# Patient Record
Sex: Female | Born: 1935 | Race: White | Hispanic: No | Marital: Married | Smoking: Never smoker
Health system: Southern US, Community
[De-identification: ages and names within clinical notes are randomized; demographics above are authoritative.]

## PROBLEM LIST (undated history)

## (undated) DIAGNOSIS — C539 Malignant neoplasm of cervix uteri, unspecified: Secondary | ICD-10-CM

## (undated) DIAGNOSIS — E785 Hyperlipidemia, unspecified: Secondary | ICD-10-CM

## (undated) DIAGNOSIS — E538 Deficiency of other specified B group vitamins: Secondary | ICD-10-CM

## (undated) DIAGNOSIS — F0390 Unspecified dementia without behavioral disturbance: Secondary | ICD-10-CM

## (undated) DIAGNOSIS — I10 Essential (primary) hypertension: Secondary | ICD-10-CM

## (undated) DIAGNOSIS — D869 Sarcoidosis, unspecified: Secondary | ICD-10-CM

## (undated) DIAGNOSIS — C50919 Malignant neoplasm of unspecified site of unspecified female breast: Secondary | ICD-10-CM

## (undated) DIAGNOSIS — M5136 Other intervertebral disc degeneration, lumbar region: Secondary | ICD-10-CM

---

## 1998-10-06 ENCOUNTER — Other Ambulatory Visit: Admission: RE | Admit: 1998-10-06 | Discharge: 1998-10-06 | Payer: Self-pay | Admitting: *Deleted

## 2001-07-29 ENCOUNTER — Ambulatory Visit: Admission: RE | Admit: 2001-07-29 | Discharge: 2001-07-29 | Payer: Self-pay | Admitting: Gynecology

## 2001-07-30 ENCOUNTER — Ambulatory Visit: Admission: RE | Admit: 2001-07-30 | Discharge: 2001-10-28 | Payer: Self-pay | Admitting: Radiation Oncology

## 2001-09-23 ENCOUNTER — Inpatient Hospital Stay (HOSPITAL_COMMUNITY): Admission: RE | Admit: 2001-09-23 | Discharge: 2001-09-26 | Payer: Self-pay | Admitting: Radiation Oncology

## 2002-01-07 ENCOUNTER — Ambulatory Visit: Admission: RE | Admit: 2002-01-07 | Discharge: 2002-01-07 | Payer: Self-pay | Admitting: Gynecology

## 2002-01-07 ENCOUNTER — Other Ambulatory Visit: Admission: RE | Admit: 2002-01-07 | Discharge: 2002-01-07 | Payer: Self-pay | Admitting: Gynecology

## 2002-04-14 ENCOUNTER — Other Ambulatory Visit: Admission: RE | Admit: 2002-04-14 | Discharge: 2002-04-14 | Payer: Self-pay | Admitting: Radiation Oncology

## 2002-07-15 ENCOUNTER — Ambulatory Visit: Admission: RE | Admit: 2002-07-15 | Discharge: 2002-07-15 | Payer: Self-pay | Admitting: Gynecology

## 2002-07-15 ENCOUNTER — Other Ambulatory Visit: Admission: RE | Admit: 2002-07-15 | Discharge: 2002-07-15 | Payer: Self-pay | Admitting: Gynecology

## 2002-10-20 ENCOUNTER — Other Ambulatory Visit: Admission: RE | Admit: 2002-10-20 | Discharge: 2002-10-20 | Payer: Self-pay | Admitting: Radiation Oncology

## 2003-12-15 ENCOUNTER — Other Ambulatory Visit: Admission: RE | Admit: 2003-12-15 | Discharge: 2003-12-15 | Payer: Self-pay | Admitting: Gynecologic Oncology

## 2003-12-15 ENCOUNTER — Ambulatory Visit: Admission: RE | Admit: 2003-12-15 | Discharge: 2003-12-15 | Payer: Self-pay | Admitting: Gynecologic Oncology

## 2004-06-06 ENCOUNTER — Ambulatory Visit: Admission: RE | Admit: 2004-06-06 | Discharge: 2004-06-06 | Payer: Self-pay | Admitting: Radiation Oncology

## 2004-06-06 ENCOUNTER — Other Ambulatory Visit: Admission: RE | Admit: 2004-06-06 | Discharge: 2004-06-06 | Payer: Self-pay | Admitting: Radiation Oncology

## 2004-11-10 ENCOUNTER — Other Ambulatory Visit: Admission: RE | Admit: 2004-11-10 | Discharge: 2004-11-10 | Payer: Self-pay | Admitting: Gynecology

## 2004-11-10 ENCOUNTER — Ambulatory Visit: Admission: RE | Admit: 2004-11-10 | Discharge: 2004-11-10 | Payer: Self-pay | Admitting: Gynecology

## 2013-07-27 ENCOUNTER — Emergency Department (HOSPITAL_COMMUNITY)
Admission: EM | Admit: 2013-07-27 | Discharge: 2013-07-27 | Disposition: A | Discharge status: Skilled Nursing Facility | Payer: Medicare Other | Attending: Emergency Medicine | Admitting: Emergency Medicine

## 2013-07-27 ENCOUNTER — Encounter (HOSPITAL_COMMUNITY): Payer: Self-pay | Admitting: Emergency Medicine

## 2013-07-27 ENCOUNTER — Emergency Department (HOSPITAL_COMMUNITY): Payer: Medicare Other

## 2013-07-27 DIAGNOSIS — Z79899 Other long term (current) drug therapy: Secondary | ICD-10-CM | POA: Insufficient documentation

## 2013-07-27 DIAGNOSIS — Z862 Personal history of diseases of the blood and blood-forming organs and certain disorders involving the immune mechanism: Secondary | ICD-10-CM | POA: Insufficient documentation

## 2013-07-27 DIAGNOSIS — S0990XA Unspecified injury of head, initial encounter: Secondary | ICD-10-CM

## 2013-07-27 DIAGNOSIS — E538 Deficiency of other specified B group vitamins: Secondary | ICD-10-CM | POA: Insufficient documentation

## 2013-07-27 DIAGNOSIS — S0180XA Unspecified open wound of other part of head, initial encounter: Secondary | ICD-10-CM | POA: Insufficient documentation

## 2013-07-27 DIAGNOSIS — Y9210 Unspecified residential institution as the place of occurrence of the external cause: Secondary | ICD-10-CM | POA: Insufficient documentation

## 2013-07-27 DIAGNOSIS — S0181XA Laceration without foreign body of other part of head, initial encounter: Secondary | ICD-10-CM

## 2013-07-27 DIAGNOSIS — Z880 Allergy status to penicillin: Secondary | ICD-10-CM | POA: Insufficient documentation

## 2013-07-27 DIAGNOSIS — Y939 Activity, unspecified: Secondary | ICD-10-CM | POA: Insufficient documentation

## 2013-07-27 DIAGNOSIS — W19XXXA Unspecified fall, initial encounter: Secondary | ICD-10-CM

## 2013-07-27 DIAGNOSIS — Z853 Personal history of malignant neoplasm of breast: Secondary | ICD-10-CM | POA: Insufficient documentation

## 2013-07-27 DIAGNOSIS — I10 Essential (primary) hypertension: Secondary | ICD-10-CM | POA: Insufficient documentation

## 2013-07-27 DIAGNOSIS — Z8619 Personal history of other infectious and parasitic diseases: Secondary | ICD-10-CM | POA: Insufficient documentation

## 2013-07-27 DIAGNOSIS — Z8639 Personal history of other endocrine, nutritional and metabolic disease: Secondary | ICD-10-CM | POA: Insufficient documentation

## 2013-07-27 DIAGNOSIS — M51379 Other intervertebral disc degeneration, lumbosacral region without mention of lumbar back pain or lower extremity pain: Secondary | ICD-10-CM | POA: Insufficient documentation

## 2013-07-27 DIAGNOSIS — M5137 Other intervertebral disc degeneration, lumbosacral region: Secondary | ICD-10-CM | POA: Insufficient documentation

## 2013-07-27 DIAGNOSIS — Z8541 Personal history of malignant neoplasm of cervix uteri: Secondary | ICD-10-CM | POA: Insufficient documentation

## 2013-07-27 DIAGNOSIS — W1809XA Striking against other object with subsequent fall, initial encounter: Secondary | ICD-10-CM | POA: Insufficient documentation

## 2013-07-27 DIAGNOSIS — F0390 Unspecified dementia without behavioral disturbance: Secondary | ICD-10-CM | POA: Insufficient documentation

## 2013-07-27 HISTORY — DX: Sarcoidosis, unspecified: D86.9

## 2013-07-27 HISTORY — DX: Hyperlipidemia, unspecified: E78.5

## 2013-07-27 HISTORY — DX: Deficiency of other specified B group vitamins: E53.8

## 2013-07-27 HISTORY — DX: Malignant neoplasm of cervix uteri, unspecified: C53.9

## 2013-07-27 HISTORY — DX: Essential (primary) hypertension: I10

## 2013-07-27 HISTORY — DX: Other intervertebral disc degeneration, lumbar region: M51.36

## 2013-07-27 HISTORY — DX: Malignant neoplasm of unspecified site of unspecified female breast: C50.919

## 2013-07-27 HISTORY — DX: Unspecified dementia, unspecified severity, without behavioral disturbance, psychotic disturbance, mood disturbance, and anxiety: F03.90

## 2013-07-27 NOTE — ED Notes (Signed)
Bed: WA05 Expected date:  Expected time:  Means of arrival:  Comments: fall 

## 2013-07-27 NOTE — ED Notes (Addendum)
[facility] called for transport.  

## 2013-07-27 NOTE — ED Notes (Signed)
Patient transported to CT 

## 2013-07-27 NOTE — ED Notes (Signed)
Pt tripped and fell over O2 tubing.  Pt attempted to catch herself but was unsuccessful.  Pt has laceration above right eye.  Pt is drowsy.  No LOC reported.

## 2013-07-27 NOTE — ED Provider Notes (Signed)
CSN: [id number]     Arrival date & time 07/27/13  1337 History   First MD Initiated Contact with Patient 07/27/13 1405     Chief Complaint  Patient presents with  . Fall   (Consider location/radiation/quality/duration/timing/severity/associated sxs/prior Treatment) HPI Comments: Patient with h/o dementia with witnessed fall at her nursing facility. She apparently tripped over oxygen tubing and fell, striking her head. C/o laceration above R eye. No LOC per EMS. Level V caveat 2/2 dementia.   The history is provided by the patient.    Past Medical History  Diagnosis Date  . Dementia   . Cervical cancer   . Breast cancer   . DDD (degenerative disc disease), lumbar   . Hyperlipidemia   . Hypertension   . Vitamin B 12 deficiency   . Sarcoidosis    History reviewed. No pertinent past surgical history. History reviewed. No pertinent family history. History  Substance Use Topics  . Smoking status: Never Smoker   . Smokeless tobacco: Not on file  . Alcohol Use: No   OB History   Grav Para Term Preterm Abortions TAB SAB Ect Mult Living                 Review of Systems  Unable to perform ROS: Dementia    Allergies  Morphine and related; Other; and Penicillins  Home Medications   Current Outpatient Rx  Name  Route  Sig  Dispense  Refill  . ALPRAZolam (XANAX) 0.25 MG tablet   Oral   Take 0.25 mg by mouth every 8 (eight) hours as needed for anxiety.         . mometasone-formoterol (DULERA) 100-5 MCG/ACT AERO   Inhalation   Inhale 2 puffs into the lungs 2 (two) times daily.         . traMADol (ULTRAM) 50 MG tablet   Oral   Take 50 mg by mouth every 6 (six) hours as needed for pain.         . vitamin B-12 (CYANOCOBALAMIN) 500 MCG tablet   Oral   Take 500 mcg by mouth daily.          BP 139/89  Pulse 87  Temp(Src) 98.5 F (36.9 C) (Axillary)  Resp 20  SpO2 100% Physical Exam  Nursing note and vitals reviewed. Constitutional: She appears well-developed  and well-nourished.  HENT:  Head: Normocephalic. Head is without raccoon's eyes and without Battle's sign.  Right Ear: Tympanic membrane, external ear and ear canal normal. No hemotympanum.  Left Ear: Tympanic membrane, external ear and ear canal normal. No hemotympanum.  Nose: Nose normal. No nasal septal hematoma.  Mouth/Throat: Uvula is midline, oropharynx is clear and moist and mucous membranes are normal.  1cm laceration just above lateral margin of right eye.  Eyes: Conjunctivae, EOM and lids are normal. Pupils are equal, round, and reactive to light. Right eye exhibits no nystagmus. Left eye exhibits no nystagmus.  No visible hyphema noted  Neck: Normal range of motion. Neck supple.  Cardiovascular: Normal rate and regular rhythm.   Pulmonary/Chest: Effort normal and breath sounds normal.  Abdominal: Soft. There is no tenderness.  Musculoskeletal:       Cervical back: She exhibits normal range of motion, no tenderness and no bony tenderness.       Thoracic back: She exhibits no tenderness and no bony tenderness.       Lumbar back: She exhibits no tenderness and no bony tenderness.  Gross movement all extremities.   Neurological:  She is alert. She has normal strength. No cranial nerve deficit or sensory deficit. Coordination normal. GCS eye subscore is 4. GCS verbal subscore is 5. GCS motor subscore is 6.  Limited ability to cooperate with neuro exam due to dementia.   Skin: Skin is warm and dry.  Psychiatric: She has a normal mood and affect.    ED Course  Procedures (including critical care time) Labs Review Labs Reviewed - No data to display Imaging Review Ct Head Wo Contrast  07/27/2013   *RADIOLOGY REPORT*  Clinical Data:  79 year old female with fall, head and neck injury, confusion and altered mental status  CT HEAD WITHOUT CONTRAST CT CERVICAL SPINE WITHOUT CONTRAST  Technique:  Multidetector CT imaging of the head and cervical spine was performed following the standard  protocol without intravenous contrast.  Multiplanar CT image reconstructions of the cervical spine were also generated.  Comparison:  None  CT HEAD  Findings: Atrophy and chronic small vessel white matter ischemic changes are noted. No acute intracranial abnormalities are identified, including mass lesion or mass effect, hydrocephalus, extra-axial fluid collection, midline shift, hemorrhage, or acute infarction.  The visualized bony calvarium is unremarkable. Right forehead soft tissue swelling is noted.  IMPRESSION: No evidence of acute intracranial abnormality.  Right forehead soft tissue swelling without fracture.  Atrophy and chronic small vessel white matter ischemic changes.  CT CERVICAL SPINE  Findings: Normal alignment is noted. There is no evidence of fracture, subluxation or prevertebral soft tissue swelling. No focal bony lesions are present. Moderate multilevel degenerative disc disease and facet arthropathy throughout the cervical spine noted. The soft tissue structures are within normal limits.  IMPRESSION: No static evidence of acute injury to the cervical spine.  Moderate multilevel degenerative changes.   Original Report Authenticated By: Harmon Pier, M.D.   Ct Cervical Spine Wo Contrast  07/27/2013   *RADIOLOGY REPORT*  Clinical Data:  79 year old female with fall, head and neck injury, confusion and altered mental status  CT HEAD WITHOUT CONTRAST CT CERVICAL SPINE WITHOUT CONTRAST  Technique:  Multidetector CT imaging of the head and cervical spine was performed following the standard protocol without intravenous contrast.  Multiplanar CT image reconstructions of the cervical spine were also generated.  Comparison:  None  CT HEAD  Findings: Atrophy and chronic small vessel white matter ischemic changes are noted. No acute intracranial abnormalities are identified, including mass lesion or mass effect, hydrocephalus, extra-axial fluid collection, midline shift, hemorrhage, or acute infarction.   The visualized bony calvarium is unremarkable. Right forehead soft tissue swelling is noted.  IMPRESSION: No evidence of acute intracranial abnormality.  Right forehead soft tissue swelling without fracture.  Atrophy and chronic small vessel white matter ischemic changes.  CT CERVICAL SPINE  Findings: Normal alignment is noted. There is no evidence of fracture, subluxation or prevertebral soft tissue swelling. No focal bony lesions are present. Moderate multilevel degenerative disc disease and facet arthropathy throughout the cervical spine noted. The soft tissue structures are within normal limits.  IMPRESSION: No static evidence of acute injury to the cervical spine.  Moderate multilevel degenerative changes.   Original Report Authenticated By: Harmon Pier, M.D.    3:10 PM Patient seen and examined. Work-up initiated.   Vital signs reviewed and are as follows: Filed Vitals:   07/27/13 1349  BP: 139/89  Pulse: 87  Temp: 98.5 F (36.9 C)  Resp: 20   LACERATION REPAIR Performed by: Dianah Field PA-S under my supervision Authorized by: Carolee Rota Consent:  Verbal consent obtained. Risks and benefits: risks, benefits and alternatives were discussed Consent given by: patient Patient identity confirmed: provided demographic data Prepped and Draped in normal sterile fashion Wound explored  Laceration Location: lateral to R eyebrow  Laceration Length: 1cm  No Foreign Bodies seen or palpated  Anesthesia: local infiltration  Local anesthetic: lidocaine 2% with epinephrine  Anesthetic total: 5 ml  Irrigation method: skin scrub with dermal cleanser Amount of cleaning: standard  Skin closure:   Number of sutures: 2  Technique: simple interrupted  Patient tolerance: Patient tolerated the procedure well with no immediate complications.  3:14 PM CTs neg. Family counseled on wound care.   Counseled on head injury precautions and symptoms that should indicate  their return to the ED.  These include severe worsening headache, vision changes, confusion, loss of consciousness, trouble walking, nausea & vomiting, or weakness/tingling in extremities.     MDM   1. Facial laceration, initial encounter   2. Head injury, initial encounter   3. Fall, initial encounter    Pt with fall caused by tripping on tubing. CT head, neck shows no acute injury. Laceration repaired without complications.    Renne Crigler, PA-C 07/27/13 1523

## 2013-07-27 NOTE — ED Notes (Signed)
Pt arrives by EMS soiled in feces and urine through clothes and brief.

## 2013-07-30 NOTE — ED Provider Notes (Signed)
Medical screening examination/treatment/procedure(s) were conducted as a shared visit with non-physician practitioner(s) and myself.  I personally evaluated the patient during the encounter.  Excellent laceration repair. Patient mentating at baseline. Normal studies. No sign of skull fracture. Ground-level fall per report patient unable to provide additional details, I agree with discharge   Claudean Kinds, MD 07/30/13 [phone]

## 2013-10-26 ENCOUNTER — Encounter (HOSPITAL_COMMUNITY): Payer: Self-pay | Admitting: Emergency Medicine

## 2013-10-26 ENCOUNTER — Emergency Department (HOSPITAL_COMMUNITY): Payer: Medicare Other

## 2013-10-26 ENCOUNTER — Emergency Department (HOSPITAL_COMMUNITY)
Admission: EM | Admit: 2013-10-26 | Discharge: 2013-10-26 | Disposition: A | Discharge status: Home / Self Care | Payer: Medicare Other | Attending: Emergency Medicine | Admitting: Emergency Medicine

## 2013-10-26 DIAGNOSIS — Y9389 Activity, other specified: Secondary | ICD-10-CM | POA: Insufficient documentation

## 2013-10-26 DIAGNOSIS — M542 Cervicalgia: Secondary | ICD-10-CM | POA: Insufficient documentation

## 2013-10-26 DIAGNOSIS — D869 Sarcoidosis, unspecified: Secondary | ICD-10-CM | POA: Insufficient documentation

## 2013-10-26 DIAGNOSIS — F0390 Unspecified dementia without behavioral disturbance: Secondary | ICD-10-CM | POA: Insufficient documentation

## 2013-10-26 DIAGNOSIS — I10 Essential (primary) hypertension: Secondary | ICD-10-CM | POA: Insufficient documentation

## 2013-10-26 DIAGNOSIS — W19XXXA Unspecified fall, initial encounter: Secondary | ICD-10-CM

## 2013-10-26 DIAGNOSIS — W050XXA Fall from non-moving wheelchair, initial encounter: Secondary | ICD-10-CM | POA: Insufficient documentation

## 2013-10-26 DIAGNOSIS — E538 Deficiency of other specified B group vitamins: Secondary | ICD-10-CM | POA: Insufficient documentation

## 2013-10-26 DIAGNOSIS — Y9210 Unspecified residential institution as the place of occurrence of the external cause: Secondary | ICD-10-CM | POA: Insufficient documentation

## 2013-10-26 DIAGNOSIS — M25569 Pain in unspecified knee: Secondary | ICD-10-CM | POA: Insufficient documentation

## 2013-10-26 DIAGNOSIS — E785 Hyperlipidemia, unspecified: Secondary | ICD-10-CM | POA: Insufficient documentation

## 2013-10-26 DIAGNOSIS — M51379 Other intervertebral disc degeneration, lumbosacral region without mention of lumbar back pain or lower extremity pain: Secondary | ICD-10-CM | POA: Insufficient documentation

## 2013-10-26 DIAGNOSIS — M25559 Pain in unspecified hip: Secondary | ICD-10-CM | POA: Insufficient documentation

## 2013-10-26 DIAGNOSIS — M5137 Other intervertebral disc degeneration, lumbosacral region: Secondary | ICD-10-CM | POA: Insufficient documentation

## 2013-10-26 LAB — URINALYSIS, ROUTINE W REFLEX MICROSCOPIC
Bilirubin Urine: NEGATIVE
Ketones, ur: NEGATIVE mg/dL
Leukocytes, UA: NEGATIVE
Nitrite: NEGATIVE
Protein, ur: NEGATIVE mg/dL
Specific Gravity, Urine: 1.007 (ref 1.005–1.030)
Urobilinogen, UA: 0.2 mg/dL (ref 0.0–1.0)
pH: 6 (ref 5.0–8.0)

## 2013-10-26 MED ORDER — HALOPERIDOL LACTATE 5 MG/ML IJ SOLN
2.5000 mg | Freq: Once | INTRAMUSCULAR | Status: AC
  Administered 2013-10-26: 2.5 mg via INTRAMUSCULAR
  Filled 2013-10-26: qty 1

## 2013-10-26 NOTE — Progress Notes (Signed)
   CARE MANAGEMENT ED NOTE 10/26/2013  Patient:  CARROL, HOUGLAND   Account Number:  [id number]  Date Initiated:  10/26/2013  Documentation initiated by:  Edd Arbour  Subjective/Objective Assessment:   79 yr old female medicare pt pcp is greg grisso per facility notes     Subjective/Objective Assessment Detail:     Action/Plan:   EPIC update   Action/Plan Detail:   Anticipated DC Date:       Status Recommendation to Physician:   Result of Recommendation:    Other ED Services  Consult Working Plan    DC Planning Services  Outpatient Services - Pt will follow up  PCP issues  Other    Choice offered to / List presented to:            Status of service:  Completed, signed off  ED Comments:   ED Comments Detail:

## 2013-10-26 NOTE — ED Notes (Addendum)
Per EMS pt was trying to sit in wheelchair, missed wheelchair and hit head without LOC. Pt complaint of right sided posterior head, neck, and back pain on EMS arrival. Pt denies denies pain at present time. Pt has hx of dementia, a/o to self.

## 2013-10-26 NOTE — ED Provider Notes (Signed)
CSN: [id number]     Arrival date & time 10/26/13  1158 History   First MD Initiated Contact with Patient 10/26/13 1216     Chief Complaint  Patient presents with  . Fall   (Consider location/radiation/quality/duration/timing/severity/associated sxs/prior Treatment) HPI 79 yo presents to the ED by EMS. Per EMS patient had a fall while transferring to her wheelchair and that patient reports posterior head and neck pain. I contacted [facility] nursing home and spoke with Arminda Resides (Med tech) who reported that she had been combative over the past week which is out of the normal for her behavior. Received mixed statements about wether the fall was witnessed or not. Nursing home reports it was unwitnessed. Patient denies any pain currently. Patient is poor historian. Level V caveat secondary to Dementia.   Past Medical History  Diagnosis Date  . Dementia   . Cervical cancer   . Breast cancer   . DDD (degenerative disc disease), lumbar   . Hyperlipidemia   . Hypertension   . Vitamin B 12 deficiency   . Sarcoidosis    History reviewed. No pertinent past surgical history. No family history on file. History  Substance Use Topics  . Smoking status: Never Smoker   . Smokeless tobacco: Not on file  . Alcohol Use: No   OB History   Grav Para Term Preterm Abortions TAB SAB Ect Mult Living                 Review of Systems  Unable to perform ROS: Dementia    Allergies  Morphine and related; Other; and Penicillins  Home Medications   Current Outpatient Rx  Name  Route  Sig  Dispense  Refill  . mometasone-formoterol (DULERA) 100-5 MCG/ACT AERO   Inhalation   Inhale 2 puffs into the lungs 2 (two) times daily.         . vitamin B-12 (CYANOCOBALAMIN) 500 MCG tablet   Oral   Take 500 mcg by mouth daily.         . vitamin D, CHOLECALCIFEROL, 400 UNITS tablet   Oral   Take 400 Units by mouth daily.         Marland Kitchen ALPRAZolam (XANAX) 0.25 MG tablet   Oral   Take 0.25 mg  by mouth every 8 (eight) hours as needed for anxiety.         . traMADol (ULTRAM) 50 MG tablet   Oral   Take 50 mg by mouth every 6 (six) hours as needed for pain.          BP 124/66  Pulse 63  Resp 30  SpO2 94% Physical Exam  Vitals reviewed. Constitutional: Vital signs are normal. She appears well-developed and well-nourished. She is active. She does not appear ill. No distress. Cervical collar and nasal cannula in place.  HENT:  Head: Normocephalic and atraumatic. Head is without raccoon's eyes, without Battle's sign and without laceration.  Right Ear: External ear normal.  Left Ear: External ear normal.  Nose: Nose normal.  Mouth/Throat: Oropharynx is clear and moist.  Eyes: Conjunctivae and EOM are normal. Pupils are equal, round, and reactive to light. No scleral icterus.  Neck:  Patient in cervical collar.   Cardiovascular: Normal rate and regular rhythm.  Exam reveals no gallop and no friction rub.   No murmur heard. Pulmonary/Chest: Effort normal. No respiratory distress. She has no wheezes. She has no rales. She exhibits no tenderness.  Abdominal: Soft. Bowel sounds are normal. She exhibits  no distension. There is no tenderness. There is no rebound and no guarding.  Musculoskeletal: She exhibits no edema.       Right hip: She exhibits tenderness.       Left knee: Tenderness found.  Neurological: She is alert. She has normal strength. No cranial nerve deficit or sensory deficit.  Reflex Scores:      Bicep reflexes are 2+ on the right side and 2+ on the left side.      Patellar reflexes are 2+ on the right side and 2+ on the left side. Patient follows commands and answers questions. But is unaware of what has happened.  Skin: Skin is warm and dry. No rash noted. She is not diaphoretic.  Psychiatric: Her speech is not slurred. Cognition and memory are impaired.    ED Course  Procedures (including critical care time) Labs Review Labs Reviewed  URINALYSIS, ROUTINE  W REFLEX MICROSCOPIC   Imaging Review Dg Hip Complete Right  10/26/2013   CLINICAL DATA:  Fall  EXAM: RIGHT HIP - COMPLETE 2+ VIEW  COMPARISON:  None.  FINDINGS: Three views of right hip submitted. No acute fracture or subluxation. Diffuse osteopenia. Mild spurring of greater femoral trochanter. Mild bilateral superior acetabular spurring.  IMPRESSION: No acute fracture or subluxation.  Diffuse osteopenia.   Electronically Signed   By: Natasha Mead M.D.   On: 10/26/2013 14:54   Ct Head Wo Contrast  10/26/2013   CLINICAL DATA:  Fall  EXAM: CT HEAD WITHOUT CONTRAST  CT CERVICAL SPINE WITHOUT CONTRAST  TECHNIQUE: Multidetector CT imaging of the head and cervical spine was performed following the standard protocol without intravenous contrast. Multiplanar CT image reconstructions of the cervical spine were also generated.  COMPARISON:  CT 07/27/2013  FINDINGS: CT HEAD FINDINGS  Moderate to advanced atrophy. Moderate chronic microvascular ischemic change in the white matter. No acute infarct. Negative for hemorrhage or mass. Negative for skull fracture.  CT CERVICAL SPINE FINDINGS  Normal cervical alignment. Negative for fracture or mass. No bony lesion is seen.  Moderate spondylosis in the cervical spine C3 through C7 with spurring. Right facet degeneration most prominent C3-4.  Severe bilateral lung disease in the apices most likely chronic.  IMPRESSION: Atrophy and chronic microvascular ischemic change. No acute intracranial abnormality.  Cervical spondylosis without fracture.   Electronically Signed   By: Marlan Palau M.D.   On: 10/26/2013 13:12   Ct Cervical Spine Wo Contrast  10/26/2013   CLINICAL DATA:  Fall  EXAM: CT HEAD WITHOUT CONTRAST  CT CERVICAL SPINE WITHOUT CONTRAST  TECHNIQUE: Multidetector CT imaging of the head and cervical spine was performed following the standard protocol without intravenous contrast. Multiplanar CT image reconstructions of the cervical spine were also generated.   COMPARISON:  CT 07/27/2013  FINDINGS: CT HEAD FINDINGS  Moderate to advanced atrophy. Moderate chronic microvascular ischemic change in the white matter. No acute infarct. Negative for hemorrhage or mass. Negative for skull fracture.  CT CERVICAL SPINE FINDINGS  Normal cervical alignment. Negative for fracture or mass. No bony lesion is seen.  Moderate spondylosis in the cervical spine C3 through C7 with spurring. Right facet degeneration most prominent C3-4.  Severe bilateral lung disease in the apices most likely chronic.  IMPRESSION: Atrophy and chronic microvascular ischemic change. No acute intracranial abnormality.  Cervical spondylosis without fracture.   Electronically Signed   By: Marlan Palau M.D.   On: 10/26/2013 13:12   Dg Knee Complete 4 Views Left  10/26/2013  CLINICAL DATA:  Pain status post fall.  EXAM: LEFT KNEE - COMPLETE 4+ VIEW  COMPARISON:  None.  FINDINGS: The bones are osteopenic. There is no evidence of an acute fracture or dislocation. There is mild beaking of the medial tibial spine. There is no evidence of a joint effusion.  IMPRESSION: There is mild diffuse osteopenia with mild osteoarthritic change. There is no evidence of an acute fracture nor dislocation.   Electronically Signed   By: David  [place]   On: 10/26/2013 14:56    EKG Interpretation   None       MDM  No diagnosis found. Patient presents per EMS with recent hx of fall from wheelchair. Patient has hx of dementia and oriented only to self. Patient is poor historian. Level V caveat for Dementia. Patient reports head/neck pain. Physical exam reveals RIGHT hip pain and LEFT knee pain. Plain films of RIGHT Hip and LEFT Knee negative for acute findings. CT head/neck negative for any acute findings. U/A ordered after reports from Nursing home of patient not acting herself. Plan to treat patient appropriately outpatient based on results of UA and discharge home. Patient discussed with attending Dr. Adriana Simas.   Patient  signed over to [facility].   Rudene Anda, PA-C 10/28/13 [phone]   Medical screening examination/treatment/procedure(s) were conducted as a shared visit with non-physician practitioner(s) and myself.  I personally evaluated the patient during the encounter.  EKG Interpretation   None      Patient has profound dementia. We attempted to x-ray areas that appeared to be hurting. CT head and CT cervical spine negative for acute findings.  Patient does not warrant admission.  Donnetta Hutching, MD 10/28/13 [phone]

## 2013-10-26 NOTE — ED Notes (Signed)
Bed: WA19 Expected date:  Expected time:  Means of arrival:  Comments: ems 

## 2013-10-26 NOTE — ED Notes (Signed)
To get vitals on patient but patient was fighting

## 2013-10-26 NOTE — ED Notes (Signed)
Report given to [facility] at [facility] at [facility], [facility] notified for transport

## 2013-11-17 ENCOUNTER — Emergency Department (HOSPITAL_COMMUNITY): Payer: Medicare Other

## 2013-11-17 ENCOUNTER — Encounter (HOSPITAL_COMMUNITY): Payer: Self-pay | Admitting: Emergency Medicine

## 2013-11-17 ENCOUNTER — Inpatient Hospital Stay (HOSPITAL_COMMUNITY)
Admission: EM | Admit: 2013-11-17 | Discharge: 2013-11-26 | DRG: 291 | Disposition: E | Payer: Medicare Other | Attending: Internal Medicine | Admitting: Internal Medicine

## 2013-11-17 DIAGNOSIS — N179 Acute kidney failure, unspecified: Secondary | ICD-10-CM | POA: Diagnosis present

## 2013-11-17 DIAGNOSIS — Z7982 Long term (current) use of aspirin: Secondary | ICD-10-CM

## 2013-11-17 DIAGNOSIS — E538 Deficiency of other specified B group vitamins: Secondary | ICD-10-CM | POA: Diagnosis present

## 2013-11-17 DIAGNOSIS — Z853 Personal history of malignant neoplasm of breast: Secondary | ICD-10-CM

## 2013-11-17 DIAGNOSIS — F03918 Unspecified dementia, unspecified severity, with other behavioral disturbance: Secondary | ICD-10-CM | POA: Diagnosis present

## 2013-11-17 DIAGNOSIS — I509 Heart failure, unspecified: Secondary | ICD-10-CM | POA: Diagnosis present

## 2013-11-17 DIAGNOSIS — R0603 Acute respiratory distress: Secondary | ICD-10-CM | POA: Diagnosis present

## 2013-11-17 DIAGNOSIS — IMO0002 Reserved for concepts with insufficient information to code with codable children: Secondary | ICD-10-CM

## 2013-11-17 DIAGNOSIS — I10 Essential (primary) hypertension: Secondary | ICD-10-CM | POA: Diagnosis present

## 2013-11-17 DIAGNOSIS — Z66 Do not resuscitate: Secondary | ICD-10-CM | POA: Diagnosis present

## 2013-11-17 DIAGNOSIS — R404 Transient alteration of awareness: Secondary | ICD-10-CM | POA: Diagnosis present

## 2013-11-17 DIAGNOSIS — I480 Paroxysmal atrial fibrillation: Secondary | ICD-10-CM | POA: Diagnosis not present

## 2013-11-17 DIAGNOSIS — Z515 Encounter for palliative care: Secondary | ICD-10-CM

## 2013-11-17 DIAGNOSIS — Y9210 Unspecified residential institution as the place of occurrence of the external cause: Secondary | ICD-10-CM | POA: Diagnosis present

## 2013-11-17 DIAGNOSIS — F0390 Unspecified dementia without behavioral disturbance: Secondary | ICD-10-CM

## 2013-11-17 DIAGNOSIS — Z9181 History of falling: Secondary | ICD-10-CM

## 2013-11-17 DIAGNOSIS — M5137 Other intervertebral disc degeneration, lumbosacral region: Secondary | ICD-10-CM | POA: Diagnosis present

## 2013-11-17 DIAGNOSIS — R7989 Other specified abnormal findings of blood chemistry: Secondary | ICD-10-CM | POA: Diagnosis present

## 2013-11-17 DIAGNOSIS — M51379 Other intervertebral disc degeneration, lumbosacral region without mention of lumbar back pain or lower extremity pain: Secondary | ICD-10-CM | POA: Diagnosis present

## 2013-11-17 DIAGNOSIS — F0391 Unspecified dementia with behavioral disturbance: Secondary | ICD-10-CM | POA: Diagnosis present

## 2013-11-17 DIAGNOSIS — W19XXXA Unspecified fall, initial encounter: Secondary | ICD-10-CM | POA: Diagnosis present

## 2013-11-17 DIAGNOSIS — R0902 Hypoxemia: Secondary | ICD-10-CM | POA: Diagnosis present

## 2013-11-17 DIAGNOSIS — I959 Hypotension, unspecified: Secondary | ICD-10-CM | POA: Diagnosis present

## 2013-11-17 DIAGNOSIS — J9600 Acute respiratory failure, unspecified whether with hypoxia or hypercapnia: Secondary | ICD-10-CM | POA: Diagnosis present

## 2013-11-17 DIAGNOSIS — I4891 Unspecified atrial fibrillation: Secondary | ICD-10-CM | POA: Diagnosis present

## 2013-11-17 DIAGNOSIS — E785 Hyperlipidemia, unspecified: Secondary | ICD-10-CM | POA: Diagnosis present

## 2013-11-17 DIAGNOSIS — J9601 Acute respiratory failure with hypoxia: Secondary | ICD-10-CM | POA: Diagnosis present

## 2013-11-17 DIAGNOSIS — I359 Nonrheumatic aortic valve disorder, unspecified: Secondary | ICD-10-CM

## 2013-11-17 DIAGNOSIS — D869 Sarcoidosis, unspecified: Secondary | ICD-10-CM | POA: Diagnosis present

## 2013-11-17 DIAGNOSIS — I5031 Acute diastolic (congestive) heart failure: Principal | ICD-10-CM | POA: Diagnosis present

## 2013-11-17 LAB — URINALYSIS, ROUTINE W REFLEX MICROSCOPIC
Leukocytes, UA: NEGATIVE
Nitrite: NEGATIVE
Protein, ur: 30 mg/dL — AB
Specific Gravity, Urine: 1.027 (ref 1.005–1.030)
Urobilinogen, UA: 1 mg/dL (ref 0.0–1.0)
pH: 5.5 (ref 5.0–8.0)

## 2013-11-17 LAB — BLOOD GAS, ARTERIAL
Drawn by: 295031
PEEP: 6 cmH2O
Patient temperature: 98.6
Pressure control: 6 cmH2O
TCO2: 24.5 mmol/L (ref 0–100)
pH, Arterial: 7.292 — ABNORMAL LOW (ref 7.350–7.450)

## 2013-11-17 LAB — CK TOTAL AND CKMB (NOT AT ARMC)
CK, MB: 3.3 ng/mL (ref 0.3–4.0)
Relative Index: INVALID (ref 0.0–2.5)
Total CK: 30 U/L (ref 7–177)

## 2013-11-17 LAB — BASIC METABOLIC PANEL
CO2: 24 mEq/L (ref 19–32)
Calcium: 9.4 mg/dL (ref 8.4–10.5)
GFR calc Af Amer: 46 mL/min — ABNORMAL LOW (ref >90)
GFR calc non Af Amer: 40 mL/min — ABNORMAL LOW (ref >90)
Potassium: 4.2 mEq/L (ref 3.5–5.1)
Sodium: 141 mEq/L (ref 135–145)

## 2013-11-17 LAB — URINE MICROSCOPIC-ADD ON

## 2013-11-17 LAB — CBC
Hemoglobin: 11.7 g/dL — ABNORMAL LOW (ref 12.0–15.0)
MCH: 28.4 pg (ref 26.0–34.0)
MCHC: 32.2 g/dL (ref 30.0–36.0)
Platelets: 233 10*3/uL (ref 150–400)
RBC: 4.12 MIL/uL (ref 3.87–5.11)

## 2013-11-17 LAB — TROPONIN I
Troponin I: <0.3 ng/mL (ref <0.30)
Troponin I: <0.3 ng/mL (ref <0.30)
Troponin I: <0.3 ng/mL (ref <0.30)

## 2013-11-17 LAB — PRO B NATRIURETIC PEPTIDE: Pro B Natriuretic peptide (BNP): 4636 pg/mL — ABNORMAL HIGH (ref 0–450)

## 2013-11-17 LAB — MRSA PCR SCREENING: MRSA by PCR: NEGATIVE

## 2013-11-17 MED ORDER — FUROSEMIDE 10 MG/ML IJ SOLN
40.0000 mg | Freq: Once | INTRAMUSCULAR | Status: AC
  Administered 2013-11-17: 40 mg via INTRAVENOUS
  Filled 2013-11-17: qty 4

## 2013-11-17 MED ORDER — ONDANSETRON HCL 4 MG/2ML IJ SOLN: 4.0000 mg | Freq: Four times a day (QID) | INTRAMUSCULAR | Status: DC | PRN

## 2013-11-17 MED ORDER — IPRATROPIUM BROMIDE 0.02 % IN SOLN
0.5000 mg | Freq: Once | RESPIRATORY_TRACT | Status: AC
  Administered 2013-11-17: 0.5 mg via RESPIRATORY_TRACT
  Filled 2013-11-17: qty 2.5

## 2013-11-17 MED ORDER — SODIUM CHLORIDE 0.9 % IJ SOLN
3.0000 mL | Freq: Two times a day (BID) | INTRAMUSCULAR | Status: DC
  Administered 2013-11-17: 3 mL via INTRAVENOUS

## 2013-11-17 MED ORDER — SODIUM CHLORIDE 0.9 % IV SOLN: 250.0000 mL | INTRAVENOUS | Status: DC | PRN

## 2013-11-17 MED ORDER — HALOPERIDOL LACTATE 5 MG/ML IJ SOLN: 2.5000 mg | Freq: Once | INTRAMUSCULAR | Status: DC

## 2013-11-17 MED ORDER — ALBUTEROL SULFATE (5 MG/ML) 0.5% IN NEBU
5.0000 mg | INHALATION_SOLUTION | Freq: Once | RESPIRATORY_TRACT | Status: AC
  Administered 2013-11-17: 5 mg via RESPIRATORY_TRACT
  Filled 2013-11-17: qty 1

## 2013-11-17 MED ORDER — SODIUM CHLORIDE 0.9 % IJ SOLN: 3.0000 mL | INTRAMUSCULAR | Status: DC | PRN

## 2013-11-17 MED ORDER — MOMETASONE FURO-FORMOTEROL FUM 100-5 MCG/ACT IN AERO
2.0000 | INHALATION_SPRAY | Freq: Two times a day (BID) | RESPIRATORY_TRACT | Status: DC
  Filled 2013-11-17: qty 8.8

## 2013-11-17 MED ORDER — FUROSEMIDE 10 MG/ML IJ SOLN
60.0000 mg | Freq: Two times a day (BID) | INTRAMUSCULAR | Status: DC
  Filled 2013-11-17 (×2): qty 6

## 2013-11-17 MED ORDER — ACETAMINOPHEN 325 MG PO TABS: 650.0000 mg | ORAL_TABLET | ORAL | Status: DC | PRN

## 2013-11-17 MED ORDER — BIOTENE DRY MOUTH MT LIQD
15.0000 mL | Freq: Two times a day (BID) | OROMUCOSAL | Status: DC
  Administered 2013-11-17 – 2013-11-19 (×5): 15 mL via OROMUCOSAL

## 2013-11-17 MED ORDER — HALOPERIDOL LACTATE 5 MG/ML IJ SOLN
2.0000 mg | Freq: Four times a day (QID) | INTRAMUSCULAR | Status: DC | PRN
  Administered 2013-11-17 – 2013-11-18 (×3): 2 mg via INTRAVENOUS
  Filled 2013-11-17 (×4): qty 1

## 2013-11-17 MED ORDER — ASPIRIN 300 MG RE SUPP
300.0000 mg | Freq: Every day | RECTAL | Status: DC
  Administered 2013-11-17 – 2013-11-19 (×3): 300 mg via RECTAL
  Filled 2013-11-17 (×3): qty 1

## 2013-11-17 MED ORDER — SODIUM CHLORIDE 0.9 % IV BOLUS (SEPSIS)
250.0000 mL | Freq: Once | INTRAVENOUS | Status: AC
  Administered 2013-11-17: 250 mL via INTRAVENOUS

## 2013-11-17 MED ORDER — CHLORHEXIDINE GLUCONATE 0.12 % MT SOLN
15.0000 mL | Freq: Two times a day (BID) | OROMUCOSAL | Status: DC
Start: 2013-11-17 — End: 2013-11-19
  Administered 2013-11-17 – 2013-11-19 (×4): 15 mL via OROMUCOSAL
  Filled 2013-11-17 (×4): qty 15

## 2013-11-17 MED ORDER — HEPARIN SODIUM (PORCINE) 5000 UNIT/ML IJ SOLN
5000.0000 [IU] | Freq: Three times a day (TID) | INTRAMUSCULAR | Status: DC
  Administered 2013-11-17 – 2013-11-19 (×6): 5000 [IU] via SUBCUTANEOUS
  Filled 2013-11-17 (×10): qty 1

## 2013-11-17 NOTE — ED Provider Notes (Signed)
Medical screening examination/treatment/procedure(s) were conducted as a shared visit with non-physician practitioner(s) and myself.  I personally evaluated the patient during the encounter.  EKG Interpretation    Date/Time:  Tuesday November 17 2013 06:14:28 EST Ventricular Rate:  85 PR Interval:  207 QRS Duration: 100 QT Interval:  397 QTC Calculation: 472 R Axis:   98 Text Interpretation:  Sinus rhythm Right axis deviation new from prior Baseline wander Confirmed by Rianna Lukes  MD, Jalee Saine ([id number]) on 11/12/2013 6:17:44 AM           Pt with fall at nursing home, found to be hypoxic and poorly responsive.  Pt started on Bipap emergently with significant improvement in 02 sats, respiratory status.  H/o sarcoid, cxr with diffuse alveolar infiltrate.  Plan for admission  Olivia Mackie, MD 11/19/13 [phone]

## 2013-11-17 NOTE — Progress Notes (Signed)
Echocardiogram 2D Echocardiogram has been performed.  Carol Perkins 11/24/2013, 1:54 PM

## 2013-11-17 NOTE — Progress Notes (Signed)
INITIAL NUTRITION ASSESSMENT  DOCUMENTATION CODES Per approved criteria  -Not Applicable   INTERVENTION: - Diet advancement per MD, recommend dysphagia 3/thin liquid as pt on this diet PTA - Will continue to monitor   NUTRITION DIAGNOSIS: Inadequate oral intake related to inability to eat as evidenced by NPO.   Goal: Advance diet as tolerated to dysphagia 3/thin liquids  Monitor:  Weight, labs, diet advancement  Reason for Assessment: Malnutrition screening tool   79 y.o. female  Admitting Dx: Acute respiratory failure with hypoxia  ASSESSMENT: Pt with history of dementia, who lives in a dementia unit at a skilled nursing facility, sarcoidosis, hypertension, possible history of breast cancer, who presents after a fall.   Obtained diet history from staff at facility who report pt was on a mechanical diet/thin liquids and typically ate 100% of breakfast and dinner and 50% of lunch, not on any nutritional supplements. Unsure of any changes in weight.   BUN/Cr elevated with low GFR  Height: Ht Readings from Last 1 Encounters:  11/19/2013 5\' 5"  (1.651 m)    Weight: Wt Readings from Last 1 Encounters:  11/02/2013 147 lb 11.3 oz (67 kg)    Ideal Body Weight: 125 lb  % Ideal Body Weight: 118%  Wt Readings from Last 10 Encounters:  11/16/2013 147 lb 11.3 oz (67 kg)    Usual Body Weight: Unable to assess    BMI:  Body mass index is 24.58 kg/(m^2).  Estimated Nutritional Needs: Kcal: 1400-1675 Protein: 70-80g Fluid: 1.4-1.6L/day  Skin: +1 RLE, LLE edema  Diet Order: NPO  EDUCATION NEEDS: -No education needs identified at this time   Intake/Output Summary (Last 24 hours) at 10/27/2013 1301 Last data filed at 11/13/2013 1200  Gross per 24 hour  Intake     30 ml  Output   1375 ml  Net  -1345 ml    Last BM: PTA  Labs:   Recent Labs Lab 11/12/2013 0650  NA 141  K 4.2  CL 105  CO2 24  BUN 36*  CREATININE 1.19*  CALCIUM 9.4  GLUCOSE 146*    CBG (last 3)   No results found for this basename: GLUCAP,  in the last 72 hours  Scheduled Meds: . antiseptic oral rinse  15 mL Mouth Rinse q12n4p  . aspirin  300 mg Rectal Daily  . chlorhexidine  15 mL Mouth Rinse BID  . furosemide  60 mg Intravenous Q12H  . heparin  5,000 Units Subcutaneous Q8H  . mometasone-formoterol  2 puff Inhalation BID  . sodium chloride  3 mL Intravenous Q12H    Continuous Infusions:   Past Medical History  Diagnosis Date  . Dementia   . Cervical cancer   . Breast cancer   . DDD (degenerative disc disease), lumbar   . Hyperlipidemia   . Hypertension   . Vitamin B 12 deficiency   . Sarcoidosis     History reviewed. No pertinent past surgical history.  Levon Hedger MS, RD, LDN [phone] Pager [phone] After Hours Pager

## 2013-11-17 NOTE — ED Notes (Signed)
Pt from [facility], where she had an unwitnessed fall in the bathroom. Facility states that it is unknown how long she had been on the floor. Pt is complaining of pain in the back of her head. Pt is alert and verbally responsive at times per EMS staff.

## 2013-11-17 NOTE — Progress Notes (Signed)
CARE MANAGEMENT NOTE 11/07/2013  Patient:  Carol Perkins, Carol Perkins   Account Number:  [id number]  Date Initiated:  11/01/2013  Documentation initiated by:  Daune Colgate  Subjective/Objective Assessment:   pt with poss chf versus resp. disorder.  o2 sats down into the low 80's.  Placed on Bipap.     Action/Plan:   from assisted living at  [facility] place.   Anticipated DC Date:  10/29/2013   Anticipated DC Plan:  ASSISTED LIVING / REST HOME  In-house referral  Clinical Social Worker      DC Planning Services  CM consult  HF Clinic      [facility] Choice  NA   Choice offered to / List presented to:  NA   DME arranged  NA      DME agency  NA     HH arranged  NA      HH agency  NA   Status of service:  In process, will continue to follow Medicare Important Message given?  NA - LOS <3 / Initial given by admissions (If response is "NO", the following Medicare IM given date fields will be blank) Date Medicare IM given:   Date Additional Medicare IM given:    Discharge Disposition:    Per UR Regulation:  Reviewed for med. necessity/level of care/duration of stay  If discussed at Long Length of Stay Meetings, dates discussed:    Comments:  11/23/2013/Irby Fails Stark Jock, BSN, [place] [phone] Chart Reviewed for discharge and hospital needs. Possible need for home eval for CHF program.  Attempted to speak to patient but she is alseep. Discharge needs at time of review:  None present will follow for needs. Review of patient progress due on [id number].

## 2013-11-17 NOTE — Progress Notes (Signed)
Pt seen, asleep, no distress at this time.  Hr 65, rr 26, sats 100% on NRB.  Bipap not placed on pt at this time, but is in room on standby.  RT will continue to monitor pt and assess for bipap needs.  RN aware.

## 2013-11-17 NOTE — ED Notes (Signed)
Bed: WA17 Expected date:  Expected time:  Means of arrival:  Comments: EMS 79yo F, unwitnessed fall

## 2013-11-17 NOTE — ED Provider Notes (Signed)
CSN: [id number]     Arrival date & time 11/03/2013  0609 History   First MD Initiated Contact with Patient 11/10/2013 [phone]     Chief Complaint  Patient presents with  . Fall   (Consider location/radiation/quality/duration/timing/severity/associated sxs/prior Treatment) HPI Comments: Patient is an 79 -year-old female with a past medical history dementia, sarcoidosis, hypertension, cervical cancer of breast cancer who presents to the emergency department from [facility] via EMS after a fall. Per EMS, patient had non-witnessed fall in bathroom, unknown how long she has been on the floor. Patient points to her head saying she is in pain, otherwise qualifies as a level V caveat due to dementia. I spoke with Sharicka [place], Med Tech at [facility] place who states she has been at her baseline, always appears short of breath and is occasionally combative, rips off her oxygen at times at night. Full code.  Patient is a 79 y.o. female presenting with fall. The history is provided by the EMS personnel.  Fall    Past Medical History  Diagnosis Date  . Dementia   . Cervical cancer   . Breast cancer   . DDD (degenerative disc disease), lumbar   . Hyperlipidemia   . Hypertension   . Vitamin B 12 deficiency   . Sarcoidosis    History reviewed. No pertinent past surgical history. History reviewed. No pertinent family history. History  Substance Use Topics  . Smoking status: Never Smoker   . Smokeless tobacco: Never Used  . Alcohol Use: No   OB History   Grav Para Term Preterm Abortions TAB SAB Ect Mult Living                 Review of Systems  Unable to perform ROS: Dementia  All other systems reviewed and are negative.    Allergies  Morphine and related; Other; and Penicillins  Home Medications   Current Outpatient Rx  Name  Route  Sig  Dispense  Refill  . ALPRAZolam (XANAX) 0.25 MG tablet   Oral   Take 0.25 mg by mouth every 8 (eight) hours as needed for anxiety.          . mometasone-formoterol (DULERA) 100-5 MCG/ACT AERO   Inhalation   Inhale 2 puffs into the lungs 2 (two) times daily.         . QUEtiapine (SEROQUEL) 25 MG tablet   Oral   Take 25 mg by mouth 2 (two) times daily.         . traMADol (ULTRAM) 50 MG tablet   Oral   Take 50 mg by mouth every 6 (six) hours as needed for pain.         . vitamin B-12 (CYANOCOBALAMIN) 500 MCG tablet   Oral   Take 500 mcg by mouth daily.         . vitamin D, CHOLECALCIFEROL, 400 UNITS tablet   Oral   Take 800 Units by mouth daily.           BP 156/85  Pulse 72  Temp(Src) 98.6 F (37 C) (Oral)  Resp 22  SpO2 93% Physical Exam  Nursing note and vitals reviewed. Constitutional: She appears well-developed and well-nourished. She appears distressed. Nasal cannula in place.  HENT:  Head: Normocephalic and atraumatic.  Mouth/Throat: Oropharynx is clear and moist.  Eyes: Conjunctivae are normal.  Neck: Normal range of motion. Neck supple.  Cardiovascular: Normal rate, regular rhythm, normal heart sounds and intact distal pulses.   +1 pitting edema LE  bilateral.  Pulmonary/Chest: Tachypnea noted. She is in respiratory distress.  Scattered crackles.  Abdominal: Soft. Bowel sounds are normal. There is no tenderness.  Musculoskeletal: Normal range of motion. She exhibits no edema.  Neurological: She is alert. GCS eye subscore is 3. GCS verbal subscore is 3. GCS motor subscore is 5.  Skin: Skin is warm and dry. She is not diaphoretic.  Psychiatric: She is not combative.    ED Course  Procedures (including critical care time)  CRITICAL CARE Performed by: Johnnette Gourd   Total critical care time: 1 hour  Critical care time was exclusive of separately billable procedures and treating other patients.  Critical care was necessary to treat or prevent imminent or life-threatening deterioration.  Critical care was time spent personally by me on the following activities: development of  treatment plan with patient and/or surrogate as well as nursing, discussions with consultants, evaluation of patient's response to treatment, examination of patient, obtaining history from patient or surrogate, ordering and performing treatments and interventions, ordering and review of laboratory studies, ordering and review of radiographic studies, pulse oximetry and re-evaluation of patient's condition.  Labs Review Labs Reviewed  CBC - Abnormal; Notable for the following:    Hemoglobin 11.7 (*)    RDW 15.6 (*)    All other components within normal limits  BASIC METABOLIC PANEL - Abnormal; Notable for the following:    Glucose, Bld 146 (*)    BUN 36 (*)    Creatinine, Ser 1.19 (*)    GFR calc non Af Amer 40 (*)    GFR calc Af Amer 46 (*)    All other components within normal limits  URINALYSIS, ROUTINE W REFLEX MICROSCOPIC - Abnormal; Notable for the following:    Color, Urine AMBER (*)    Bilirubin Urine SMALL (*)    Protein, ur 30 (*)    All other components within normal limits  PRO B NATRIURETIC PEPTIDE - Abnormal; Notable for the following:    Pro B Natriuretic peptide (BNP) 4636.0 (*)    All other components within normal limits  D-DIMER, QUANTITATIVE - Abnormal; Notable for the following:    D-Dimer, Quant 1.77 (*)    All other components within normal limits  BLOOD GAS, ARTERIAL - Abnormal; Notable for the following:    pH, Arterial 7.292 (*)    pCO2 arterial 56.2 (*)    pO2, Arterial 102.0 (*)    Bicarbonate 26.3 (*)    All other components within normal limits  URINE MICROSCOPIC-ADD ON - Abnormal; Notable for the following:    Bacteria, UA FEW (*)    Casts HYALINE CASTS (*)    All other components within normal limits  CG4 I-STAT (LACTIC ACID) - Abnormal; Notable for the following:    Lactic Acid, Venous 2.49 (*)    All other components within normal limits  CULTURE, BLOOD (ROUTINE X 2)  CULTURE, BLOOD (ROUTINE X 2)  TROPONIN I   Imaging Review Ct Head Wo  Contrast  11/10/2013   CLINICAL DATA:  Fall.  EXAM: CT HEAD WITHOUT CONTRAST  CT NECK WITHOUT CONTRAST  TECHNIQUE: Contiguous axial images were obtained from the base of the skull through the vertex without contrast. Multidetector CT imaging of the neck was performed using the standard protocol without intravenous contrast.  COMPARISON:  CT 10/26/2013 and 07/27/2013.  FINDINGS: CT HEAD FINDINGS  No mass. No hydrocephalus. No hemorrhage. White matter changes again noted consistent with chronic ischemia. Diffuse atrophy again noted. Orbits are unremarkable.  No acute bony abnormality. Motion artifact is present. Visualized paranasal sinuses are clear.  CT NECK FINDINGS  Carotid atherosclerotic vascular disease. Left parapharyngeal soft tissue fullness most likely secondary to phase of respiration/ swallowing. This has not been prior present on prior recent exams. Pulmonary apical interstitial lung disease again noted, most likely pulmonary fibrosis. Diffuse severe cervical spine degenerative change noted. There is no evidence of fracture or dislocation.  IMPRESSION: 1. Head CT reveals chronic white matter ischemic change and atrophy. Stable exam. No acute abnormality.  2. Cervical spine CT reveals no evidence of acute abnormality. Diffuse degenerative change present. Soft tissue fullness is noted in the left parapharyngeal space. This may be related to the patient's phase of respiration and or swallowing. This was not present on prior recent exams. Direct visualization however should be considered to exclude a left parapharyngeal mass.   Electronically Signed   By: Maisie Fus  Register   On: 10/30/2013 08:43   Ct Cervical Spine Wo Contrast  10/29/2013   CLINICAL DATA:  Fall.  EXAM: CT HEAD WITHOUT CONTRAST  CT NECK WITHOUT CONTRAST  TECHNIQUE: Contiguous axial images were obtained from the base of the skull through the vertex without contrast. Multidetector CT imaging of the neck was performed using the standard  protocol without intravenous contrast.  COMPARISON:  CT 10/26/2013 and 07/27/2013.  FINDINGS: CT HEAD FINDINGS  No mass. No hydrocephalus. No hemorrhage. White matter changes again noted consistent with chronic ischemia. Diffuse atrophy again noted. Orbits are unremarkable. No acute bony abnormality. Motion artifact is present. Visualized paranasal sinuses are clear.  CT NECK FINDINGS  Carotid atherosclerotic vascular disease. Left parapharyngeal soft tissue fullness most likely secondary to phase of respiration/ swallowing. This has not been prior present on prior recent exams. Pulmonary apical interstitial lung disease again noted, most likely pulmonary fibrosis. Diffuse severe cervical spine degenerative change noted. There is no evidence of fracture or dislocation.  IMPRESSION: 1. Head CT reveals chronic white matter ischemic change and atrophy. Stable exam. No acute abnormality.  2. Cervical spine CT reveals no evidence of acute abnormality. Diffuse degenerative change present. Soft tissue fullness is noted in the left parapharyngeal space. This may be related to the patient's phase of respiration and or swallowing. This was not present on prior recent exams. Direct visualization however should be considered to exclude a left parapharyngeal mass.   Electronically Signed   By: Maisie Fus  Register   On: 11/09/2013 08:43   Dg Chest Portable 1 View  11/16/2013   CLINICAL DATA:  History of sarcoidosis, severe dyspnea, afebrile, remote history of breast malignancy  EXAM: PORTABLE CHEST - 1 VIEW  COMPARISON:  None.  FINDINGS: The lungs are adequately inflated. There are confluent interstitial and alveolar infiltrates diffusely. The left and right heart borders are partially obscured. The cardiopericardial silhouette is enlarged. The left hemidiaphragm is partially obscured. The pulmonary vascularity is indistinct. No definite pleural effusion is demonstrated. There is resorption of the distal aspect of the right  clavicle. No definite acute bony abnormality is demonstrated.  IMPRESSION: Diffuse interstitial and alveolar infiltrates are most compatible with CHF. Other processes including pneumonia, alveolar sarcoidosis, alveolar proteinosis could present with similar radiographic appearances.  These findings and possible differentials were discussed with Dr. Norlene Campbell at the conclusion of the study. Followup films following therapy would be useful.   Electronically Signed   By: David  [place]   On: 11/24/2013 07:02    EKG Interpretation    Date/Time:  Tuesday November 17 2013 06:14:28  EST Ventricular Rate:  85 PR Interval:  207 QRS Duration: 100 QT Interval:  397 QTC Calculation: 472 R Axis:   98 Text Interpretation:  Sinus rhythm Right axis deviation new from prior Baseline wander Confirmed by OTTER  MD, OLGA ([id number]) on 2013-12-13 6:17:44 AM            MDM   1. Respiratory distress   2. CHF (congestive heart failure)     Pt presenting after unwitnessed fall, at baseline mentation per nursing home. She is in respiratory distress, O2 sat 83% on 4L oxygen. Complaining of head pain, otherwise unable to obtain hx from pt. Scattered crackles on lung exam. No signs of trauma. CXR showing use interstitial and alveolar infiltrates compatible with CHF. CT head/c-spine pending. Lactate 2.49. No leukocytosis. D-dimer obtained prior to CXR result, elevated at 1.77, low suspicion for PE as this is more than likely related to CHF. Pt placed on bipap, O2 sat 96%, pt RR improved, appears more comfortable. Plan to admit. Case discussed with attending Dr. Norlene Campbell who also evaluated patient and agrees with plan of care. 9:08 AM Pt remains stable on bipap, O2 sat 100%. CT head/c-spine negative. Pt will be admitted to step-down, admission accepted by Dr. Osvaldo Shipper, TRH.   Trevor Mace, PA-C 12/13/13 [phone]

## 2013-11-17 NOTE — H&P (Signed)
[facility] History and Physical  Amber Williard WUJ:[id number] DOB: 01/20/1924 DOA: 11/22/2013   PCP: Feliciana Rossetti, MD  Specialists: Unknown  Chief Complaint: Shortness of breath, and fall  HPI: Carol Perkins is a 79 y.o. female with a past medical history of dementia, who lives in a dementia unit at a skilled nursing facility, sarcoidosis, hypertension, possible history of breast cancer, who presents after a fall. Patient has advanced dementia and she is on a BiPAP and, hence no history is available from her. Most of the history was obtained from notes from other providers. Apparently, she came because she had a non-witnessed fall in the bathroom. It was unclear how long she was on the floor. The patient pointed to her head when she was asked about pain. And, then when she was brought in she was found to be short of breath with oxygen saturations in the mid 80s. Patient was placed on oxygen. Workup was done, which revealed possible congestive heart failure. There is no mention of any chest pains. No further history is available at this time due to reasons mentioned above.  Home Medications: Prior to Admission medications   Medication Sig Start Date End Date Taking? Authorizing Provider  ALPRAZolam (XANAX) 0.25 MG tablet Take 0.25 mg by mouth every 8 (eight) hours as needed for anxiety.   Yes Historical Provider, MD  mometasone-formoterol (DULERA) 100-5 MCG/ACT AERO Inhale 2 puffs into the lungs 2 (two) times daily.   Yes Historical Provider, MD  QUEtiapine (SEROQUEL) 25 MG tablet Take 25 mg by mouth 2 (two) times daily.   Yes Historical Provider, MD  traMADol (ULTRAM) 50 MG tablet Take 50 mg by mouth every 6 (six) hours as needed for pain.   Yes Historical Provider, MD  vitamin B-12 (CYANOCOBALAMIN) 500 MCG tablet Take 500 mcg by mouth daily.   Yes Historical Provider, MD  vitamin D, CHOLECALCIFEROL, 400 UNITS tablet Take 800 Units by mouth daily.    Yes Historical Provider, MD     Allergies:  Allergies  Allergen Reactions  . Morphine And Related     unknown  . Other     shellfish  . Penicillins     unknown    Past Medical History: Past Medical History  Diagnosis Date  . Dementia   . Cervical cancer   . Breast cancer   . DDD (degenerative disc disease), lumbar   . Hyperlipidemia   . Hypertension   . Vitamin B 12 deficiency   . Sarcoidosis     Unable to obtain surgical history  Social History: She lives in the memory care unit in a skilled nursing facility. Her tobacco, alcohol and drug use is unknown. Her activity level is unknown.   Family History: unable to obtain due to dementia  Review of Systems - unable to obtain due to dementia  Physical Examination  Filed Vitals:   11/11/2013 0609  0712 10/27/2013 0804  0945  BP: 143/72 156/85  162/89  Pulse: 86 72    Temp: 98.6 F (37 C)   96 F (35.6 C)  TempSrc: Oral   Axillary  Resp: 18 22    Height:    5\' 5"  (1.651 m)  Weight:    67 kg (147 lb 11.3 oz)  SpO2: 85% 95% 93% 93%    General appearance: alert, appears stated age, combative, distracted, no distress and uncooperative Head: Normocephalic, without obvious abnormality, atraumatic Eyes: conjunctivae/corneas clear. PERRL, EOM's intact.  Resp: Crackles bilateral lung fields, no wheezing. Cardio: regular rate  and rhythm, S1, S2 normal, no murmur, click, rub or gallop GI: soft, non-tender; bowel sounds normal; no masses,  no organomegaly Extremities: minimal edema bilateral LE. Pulses: 2+ and symmetric Skin: Skin color, texture, turgor normal. No rashes or lesions Lymph nodes: Cervical, supraclavicular, and axillary nodes normal. Neurologic: Alert on bipap. Started pulling on the bipap. Moving all extremities.   Laboratory Data: Results for orders placed during the hospital encounter of 11/11/2013 (from the past 48 hour(s))  CBC     Status: Abnormal   Collection Time    11/01/2013  6:50 AM      Result Value Range    WBC 7.5  4.0 - 10.5 K/uL   RBC 4.12  3.87 - 5.11 MIL/uL   Hemoglobin 11.7 (*) 12.0 - 15.0 g/dL   HCT 29.5  62.1 - 30.8 %   MCV 88.1  78.0 - 100.0 fL   MCH 28.4  26.0 - 34.0 pg   MCHC 32.2  30.0 - 36.0 g/dL   RDW 65.7 (*) 84.6 - 96.2 %   Platelets 233  150 - 400 K/uL  BASIC METABOLIC PANEL     Status: Abnormal   Collection Time    11/05/2013  6:50 AM      Result Value Range   Sodium 141  135 - 145 mEq/L   Potassium 4.2  3.5 - 5.1 mEq/L   Chloride 105  96 - 112 mEq/L   CO2 24  19 - 32 mEq/L   Glucose, Bld 146 (*) 70 - 99 mg/dL   BUN 36 (*) 6 - 23 mg/dL   Creatinine, Ser 9.52 (*) 0.50 - 1.10 mg/dL   Calcium 9.4  8.4 - 84.1 mg/dL   GFR calc non Af Amer 40 (*) >90 mL/min   GFR calc Af Amer 46 (*) >90 mL/min   Comment: (NOTE)     The eGFR has been calculated using the CKD EPI equation.     This calculation has not been validated in all clinical situations.     eGFR's persistently <90 mL/min signify possible Chronic Kidney     Disease.  TROPONIN I     Status: None   Collection Time    11/10/2013  6:50 AM      Result Value Range   Troponin I <0.30  <0.30 ng/mL   Comment:            Due to the release kinetics of cTnI,     a negative result within the first hours     of the onset of symptoms does not rule out     myocardial infarction with certainty.     If myocardial infarction is still suspected,     repeat the test at appropriate intervals.  D-DIMER, QUANTITATIVE     Status: Abnormal   Collection Time      6:50 AM      Result Value Range   D-Dimer, Quant 1.77 (*) 0.00 - 0.48 ug/mL-FEU   Comment:            AT THE INHOUSE ESTABLISHED CUTOFF     VALUE OF 0.48 ug/mL FEU,     THIS ASSAY HAS BEEN DOCUMENTED     IN THE LITERATURE TO HAVE     A SENSITIVITY AND NEGATIVE     PREDICTIVE VALUE OF AT LEAST     98 TO 99%.  THE TEST RESULT     SHOULD BE CORRELATED WITH     AN ASSESSMENT OF THE CLINICAL  PROBABILITY OF DVT / VTE.  PRO B NATRIURETIC PEPTIDE     Status:  Abnormal   Collection Time    11/24/2013  6:55 AM      Result Value Range   Pro B Natriuretic peptide (BNP) 4636.0 (*) 0 - 450 pg/mL  CG4 I-STAT (LACTIC ACID)     Status: Abnormal   Collection Time    11/12/2013  7:06 AM      Result Value Range   Lactic Acid, Venous 2.49 (*) 0.5 - 2.2 mmol/L  URINALYSIS, ROUTINE W REFLEX MICROSCOPIC     Status: Abnormal   Collection Time    11/19/2013  7:11 AM      Result Value Range   Color, Urine AMBER (*) YELLOW   Comment: BIOCHEMICALS MAY BE AFFECTED BY COLOR   APPearance CLEAR  CLEAR   Specific Gravity, Urine 1.027  1.005 - 1.030   pH 5.5  5.0 - 8.0   Glucose, UA NEGATIVE  NEGATIVE mg/dL   Hgb urine dipstick NEGATIVE  NEGATIVE   Bilirubin Urine SMALL (*) NEGATIVE   Ketones, ur NEGATIVE  NEGATIVE mg/dL   Protein, ur 30 (*) NEGATIVE mg/dL   Urobilinogen, UA 1.0  0.0 - 1.0 mg/dL   Nitrite NEGATIVE  NEGATIVE   Leukocytes, UA NEGATIVE  NEGATIVE  URINE MICROSCOPIC-ADD ON     Status: Abnormal   Collection Time    11/19/2013  7:11 AM      Result Value Range   Squamous Epithelial / LPF RARE  RARE   WBC, UA 0-2  <3 WBC/hpf   RBC / HPF 0-2  <3 RBC/hpf   Bacteria, UA FEW (*) RARE   Casts HYALINE CASTS (*) NEGATIVE   Urine-Other MUCOUS PRESENT     Comment: AMORPHOUS URATES/PHOSPHATES  BLOOD GAS, ARTERIAL     Status: Abnormal   Collection Time      7:27 AM      Result Value Range   FIO2 0.50     Delivery systems VENTILATOR     Mode NIV/PC     Peep/cpap 6.0     Pressure control 6     pH, Arterial 7.292 (*) 7.350 - 7.450   pCO2 arterial 56.2 (*) 35.0 - 45.0 mmHg   pO2, Arterial 102.0 (*) 80.0 - 100.0 mmHg   Bicarbonate 26.3 (*) 20.0 - 24.0 mEq/L   TCO2 24.5  0 - 100 mmol/L   Acid-base deficit 0.5  0.0 - 2.0 mmol/L   O2 Saturation 96.1     Patient temperature 98.6     Collection site RIGHT RADIAL     Drawn by [phone]     Sample type ARTERIAL DRAW     Allens test (pass/fail) PASS  PASS    Radiology Reports: Ct Head Wo  Contrast  11/25/2013   CLINICAL DATA:  Fall.  EXAM: CT HEAD WITHOUT CONTRAST  CT NECK WITHOUT CONTRAST  TECHNIQUE: Contiguous axial images were obtained from the base of the skull through the vertex without contrast. Multidetector CT imaging of the neck was performed using the standard protocol without intravenous contrast.  COMPARISON:  CT 10/26/2013 and 07/27/2013.  FINDINGS: CT HEAD FINDINGS  No mass. No hydrocephalus. No hemorrhage. White matter changes again noted consistent with chronic ischemia. Diffuse atrophy again noted. Orbits are unremarkable. No acute bony abnormality. Motion artifact is present. Visualized paranasal sinuses are clear.  CT NECK FINDINGS  Carotid atherosclerotic vascular disease. Left parapharyngeal soft tissue fullness most likely secondary to phase of respiration/ swallowing. This  has not been prior present on prior recent exams. Pulmonary apical interstitial lung disease again noted, most likely pulmonary fibrosis. Diffuse severe cervical spine degenerative change noted. There is no evidence of fracture or dislocation.  IMPRESSION: 1. Head CT reveals chronic white matter ischemic change and atrophy. Stable exam. No acute abnormality.  2. Cervical spine CT reveals no evidence of acute abnormality. Diffuse degenerative change present. Soft tissue fullness is noted in the left parapharyngeal space. This may be related to the patient's phase of respiration and or swallowing. This was not present on prior recent exams. Direct visualization however should be considered to exclude a left parapharyngeal mass.   Electronically Signed   By: Maisie Fus  Register   On: 11/05/2013 08:43   Ct Cervical Spine Wo Contrast  10/30/2013   CLINICAL DATA:  Fall.  EXAM: CT HEAD WITHOUT CONTRAST  CT NECK WITHOUT CONTRAST  TECHNIQUE: Contiguous axial images were obtained from the base of the skull through the vertex without contrast. Multidetector CT imaging of the neck was performed using the standard  protocol without intravenous contrast.  COMPARISON:  CT 10/26/2013 and 07/27/2013.  FINDINGS: CT HEAD FINDINGS  No mass. No hydrocephalus. No hemorrhage. White matter changes again noted consistent with chronic ischemia. Diffuse atrophy again noted. Orbits are unremarkable. No acute bony abnormality. Motion artifact is present. Visualized paranasal sinuses are clear.  CT NECK FINDINGS  Carotid atherosclerotic vascular disease. Left parapharyngeal soft tissue fullness most likely secondary to phase of respiration/ swallowing. This has not been prior present on prior recent exams. Pulmonary apical interstitial lung disease again noted, most likely pulmonary fibrosis. Diffuse severe cervical spine degenerative change noted. There is no evidence of fracture or dislocation.  IMPRESSION: 1. Head CT reveals chronic white matter ischemic change and atrophy. Stable exam. No acute abnormality.  2. Cervical spine CT reveals no evidence of acute abnormality. Diffuse degenerative change present. Soft tissue fullness is noted in the left parapharyngeal space. This may be related to the patient's phase of respiration and or swallowing. This was not present on prior recent exams. Direct visualization however should be considered to exclude a left parapharyngeal mass.   Electronically Signed   By: Maisie Fus  Register   On: 11/19/2013 08:43   Dg Chest Portable 1 View  10/27/2013   CLINICAL DATA:  History of sarcoidosis, severe dyspnea, afebrile, remote history of breast malignancy  EXAM: PORTABLE CHEST - 1 VIEW  COMPARISON:  None.  FINDINGS: The lungs are adequately inflated. There are confluent interstitial and alveolar infiltrates diffusely. The left and right heart borders are partially obscured. The cardiopericardial silhouette is enlarged. The left hemidiaphragm is partially obscured. The pulmonary vascularity is indistinct. No definite pleural effusion is demonstrated. There is resorption of the distal aspect of the right  clavicle. No definite acute bony abnormality is demonstrated.  IMPRESSION: Diffuse interstitial and alveolar infiltrates are most compatible with CHF. Other processes including pneumonia, alveolar sarcoidosis, alveolar proteinosis could present with similar radiographic appearances.  These findings and possible differentials were discussed with Dr. Norlene Campbell at the conclusion of the study. Followup films following therapy would be useful.   Electronically Signed   By: David  [place]   On:  07:02    Electrocardiogram: Sinus rhythm at 85 beats per minute. Normal axis. Intervals appear to be normal. No definite Q waves. Nonspecific T wave, changes. No concerning ST changes. Poor quality EKG.  Problem List  Principal Problem:   Acute respiratory failure with hypoxia Active Problems:   Respiratory  distress   Acute CHF   Dementia   Assessment: This is 79 year old, Caucasian female, with a past medical history of dementia, who presents after an unwitnessed fall. She doesn't seem to have sustained any head injury or cervical spine injury based on imaging studies. She was notably dyspneic and hypoxic. She has chest x-ray, which is suggestive of congestive heart failure. No known history of same.  Plan: #1 acute congestive heart failure: No previous echocardiogram available. It is unclear if this is systolic or diastolic. We will get an echocardiogram. She been diuresing quite well. Continue with Lasix. No beta blockers because her resting heart rate is in the 50s. No ACE inhibitor because her creatinine is elevated. And we also await more information from echocardiogram. Strict ins and outs. Troponin. EKG will be repeated.  #2 acute respiratory failure with hypoxia: Continue BiPAP for now. Once she has adequately diuresed it may be possible to take her off of Bipap. Restraints while she is on BiPAP as she is attempting to remove it.  #3 elevated BUN and creatinine: No older labs are available. This  could be a chronic renal insufficiency. Continue to obtain labs. Continue to monitor urine output.  #4 elevated d-dimer: Considering her chest x-ray finding is unlikely that she has thromboembolic event. There are possibly other reasons for elevated d-dimer considering her acute illness. Continue to monitor for now.  #5 history of dementia: Continue to monitor behavior.  DVT Prophylaxis: Heparin subcutaneously Code Status: DNR Family Communication: Discussed with daughter Hollie Salk at [phone]. Disposition Plan: Admit to step down   Further management decisions will depend on results of further testing and patient's response to treatment.  [facility]  [facility] Pager [phone]  If 7PM-7AM, please contact night-coverage [email or web address] Password [facility]  10/29/2013, 10:51 AM

## 2013-11-18 DIAGNOSIS — N179 Acute kidney failure, unspecified: Secondary | ICD-10-CM

## 2013-11-18 DIAGNOSIS — I5031 Acute diastolic (congestive) heart failure: Principal | ICD-10-CM

## 2013-11-18 DIAGNOSIS — F0391 Unspecified dementia with behavioral disturbance: Secondary | ICD-10-CM

## 2013-11-18 DIAGNOSIS — R0609 Other forms of dyspnea: Secondary | ICD-10-CM

## 2013-11-18 LAB — BASIC METABOLIC PANEL
BUN: 34 mg/dL — ABNORMAL HIGH (ref 6–23)
CO2: 29 mEq/L (ref 19–32)
Calcium: 8.8 mg/dL (ref 8.4–10.5)
Chloride: 104 mEq/L (ref 96–112)
Potassium: 4.1 mEq/L (ref 3.5–5.1)
Sodium: 144 mEq/L (ref 135–145)

## 2013-11-18 LAB — CBC WITH DIFFERENTIAL/PLATELET
Basophils Relative: 0 % (ref 0–1)
Eosinophils Relative: 2 % (ref 0–5)
Hemoglobin: 11.4 g/dL — ABNORMAL LOW (ref 12.0–15.0)
Lymphocytes Relative: 9 % — ABNORMAL LOW (ref 12–46)
Lymphs Abs: 0.7 10*3/uL (ref 0.7–4.0)
MCH: 28.3 pg (ref 26.0–34.0)
MCV: 91.1 fL (ref 78.0–100.0)
Monocytes Relative: 13 % — ABNORMAL HIGH (ref 3–12)
Platelets: 225 10*3/uL (ref 150–400)
RBC: 4.03 MIL/uL (ref 3.87–5.11)
WBC: 8.2 10*3/uL (ref 4.0–10.5)

## 2013-11-18 MED ORDER — FENTANYL CITRATE 0.05 MG/ML IJ SOLN
25.0000 ug | Freq: Once | INTRAMUSCULAR | Status: AC
  Administered 2013-11-18: 25 ug via INTRAVENOUS
  Filled 2013-11-18: qty 2

## 2013-11-18 MED ORDER — LORAZEPAM 2 MG/ML IJ SOLN
INTRAMUSCULAR | Status: AC
  Administered 2013-11-18: 1 mg via INTRAVENOUS
  Filled 2013-11-18: qty 1

## 2013-11-18 MED ORDER — FENTANYL CITRATE 0.05 MG/ML IJ SOLN
25.0000 ug | INTRAMUSCULAR | Status: DC | PRN
Start: 2013-11-18 — End: 2013-11-19
  Administered 2013-11-18 – 2013-11-19 (×3): 50 ug via INTRAVENOUS
  Administered 2013-11-19: 25 ug via INTRAVENOUS
  Filled 2013-11-18 (×6): qty 2

## 2013-11-18 MED ORDER — SODIUM CHLORIDE 0.9 % IV BOLUS (SEPSIS)
250.0000 mL | Freq: Once | INTRAVENOUS | Status: AC
  Administered 2013-11-18: 250 mL via INTRAVENOUS

## 2013-11-18 MED ORDER — FUROSEMIDE 10 MG/ML IJ SOLN: 20.0000 mg | Freq: Two times a day (BID) | INTRAMUSCULAR | Status: DC

## 2013-11-18 MED ORDER — FENTANYL CITRATE 0.05 MG/ML IJ SOLN
25.0000 ug | INTRAMUSCULAR | Status: AC | PRN
  Administered 2013-11-18 – 2013-11-19 (×4): 25 ug via INTRAVENOUS

## 2013-11-18 MED ORDER — LORAZEPAM 2 MG/ML IJ SOLN
0.5000 mg | INTRAMUSCULAR | Status: DC | PRN
  Administered 2013-11-18: 1 mg via INTRAVENOUS
  Administered 2013-11-19 (×2): 0.5 mg via INTRAVENOUS
  Administered 2013-11-19: 1 mg via INTRAVENOUS
  Filled 2013-11-18 (×4): qty 1

## 2013-11-18 MED ORDER — FUROSEMIDE 10 MG/ML IJ SOLN
20.0000 mg | Freq: Two times a day (BID) | INTRAMUSCULAR | Status: DC
  Administered 2013-11-18 – 2013-11-19 (×2): 20 mg via INTRAVENOUS
  Filled 2013-11-18 (×3): qty 2

## 2013-11-18 MED ORDER — LORAZEPAM 2 MG/ML IJ SOLN
1.0000 mg | Freq: Once | INTRAMUSCULAR | Status: AC
  Administered 2013-11-18: 1 mg via INTRAVENOUS

## 2013-11-18 NOTE — Progress Notes (Signed)
[facility] PROGRESS NOTE  Carol Perkins ZOX:[id number] DOB: [birth year] DOA: 2013-12-12 PCP: Feliciana Rossetti, MD  Assessment/Plan: Principal Problem:   Acute diastolic CHF (congestive heart failure): Has diuresed approximately 3 L so far. Blood pressure slightly soft, likely from Ativan and fentanyl. 1 systolic slightly increases, restart Lasix at 20 mg every 12 hours. Holding beta blocker secondary to low resting heart rate and holding ACE inhibitor secondary to elevated creatinine  Active Problems:   Respiratory distress: See below    Acute respiratory failure with hypoxia: Secondary to congestive heart failure. Improving, already off BiPAP. Try to transfer to floor once she she is down to nasal cannula oxygen    Dementia with behavioral disturbance: Try to minimize disorientation. Should improve as we transfer to floor. Acute renal failure: Slight increase in creatinine today, likely from Lasix. Decreasing Lasix dose. Recheck tomorrow  Code Status: DO NOT RESUSCITATE Family Communication: Left message with daughter Disposition Plan: Likely transfer to floor later today   Consultants:  None  Procedures:  Echocardiogram: Grade 1 diastolic dysfunction, systolic function at the lower end of normal  Antibiotics:  Not  HPI/Subjective: Patient currently somnolent. Due to agitation has received Ativan earlier  Objective: Filed Vitals:   11/18/13 1039  BP: 100/36  Pulse: 76  Temp:   Resp: 15    Intake/Output Summary (Last 24 hours) at 11/18/13 1106 Last data filed at 11/18/13 1000  Gross per 24 hour  Intake    583 ml  Output   3075 ml  Net  -2492 ml   Filed Weights   December 12, 2013 0945 12-Dec-2013 1049 11/18/13 0400  Weight: 67 kg (147 lb 11.3 oz) 67 kg (147 lb 11.3 oz) 67.8 kg (149 lb 7.6 oz)    Exam:   General:  Somnolent  Cardiovascular: Regular rate and rhythm, S1-S2, 2/6 systolic ejection murmur  Respiratory: Decreased breath sounds throughout  Abdomen:  Soft, nontender, nondistended, positive bowel sounds  Musculoskeletal: No clubbing or cyanosis, trace edema   Data Reviewed: Basic Metabolic Panel:  Recent Labs Lab December 12, 2013 0650 11/18/13 0408  NA 141 144  K 4.2 4.1  CL 105 104  CO2 24 29  GLUCOSE 146* 92  BUN 36* 34*  CREATININE 1.19* 1.31*  CALCIUM 9.4 8.8   Liver Function Tests: No results found for this basename: AST, ALT, ALKPHOS, BILITOT, PROT, ALBUMIN,  in the last 168 hours No results found for this basename: LIPASE, AMYLASE,  in the last 168 hours No results found for this basename: AMMONIA,  in the last 168 hours CBC:  Recent Labs Lab December 12, 2013 0650 11/18/13 0408  WBC 7.5 8.2  NEUTROABS  --  6.3  HGB 11.7* 11.4*  HCT 36.3 36.7  MCV 88.1 91.1  PLT 233 225   Cardiac Enzymes:  Recent Labs Lab 12-12-2013 0650 12-12-2013 1120 12/12/2013 1711  CKTOTAL  --  30  --   CKMB  --  3.3  --   TROPONINI <0.30 <0.30 <0.30   BNP (last 3 results)  Recent Labs  12-12-13 0655  PROBNP 4636.0*   CBG: No results found for this basename: GLUCAP,  in the last 168 hours  Recent Results (from the past 240 hour(s))  CULTURE, BLOOD (ROUTINE X 2)     Status: None   Collection Time    12/12/13  6:50 AM      Result Value Range Status   Specimen Description BLOOD RIGHT ARM   Final   Special Requests BOTTLES DRAWN AEROBIC AND ANAEROBIC EACH  Final   Culture  Setup Time     Final   Value: 11/01/2013 12:42     Performed at [facility]   Culture     Final   Value:        BLOOD CULTURE RECEIVED NO GROWTH TO DATE CULTURE WILL BE HELD FOR 5 DAYS BEFORE ISSUING A FINAL NEGATIVE REPORT     Performed at [facility]   Report Status PENDING   Incomplete  CULTURE, BLOOD (ROUTINE X 2)     Status: None   Collection Time    11/24/2013  6:55 AM      Result Value Range Status   Specimen Description BLOOD LEFT HAND   Final   Special Requests BOTTLES DRAWN AEROBIC AND ANAEROBIC 3.5ML EACH   Final   Culture  Setup Time      Final   Value: 10/27/2013 12:42     Performed at [facility]   Culture     Final   Value:        BLOOD CULTURE RECEIVED NO GROWTH TO DATE CULTURE WILL BE HELD FOR 5 DAYS BEFORE ISSUING A FINAL NEGATIVE REPORT     Performed at [facility]   Report Status PENDING   Incomplete  MRSA PCR SCREENING     Status: None   Collection Time    10/30/2013  9:47 AM      Result Value Range Status   MRSA by PCR NEGATIVE  NEGATIVE Final   Comment:            The GeneXpert MRSA Assay (FDA     approved for NASAL specimens     only), is one component of a     comprehensive MRSA colonization     surveillance program. It is not     intended to diagnose MRSA     infection nor to guide or     monitor treatment for     MRSA infections.     Studies: Ct Head Wo Contrast  11/23/2013     IMPRESSION: 1. Head CT reveals chronic white matter ischemic change and atrophy. Stable exam. No acute abnormality.  2. Cervical spine CT reveals no evidence of acute abnormality. Diffuse degenerative change present. Soft tissue fullness is noted in the left parapharyngeal space. This may be related to the patient's phase of respiration and or swallowing. This was not present on prior recent exams. Direct visualization however should be considered to exclude a left parapharyngeal mass.   Electronically Signed   By: Maisie Fus  Register   On: 10/27/2013 08:43   Ct Cervical Spine Wo Contrast  11/02/2013   CLINICAL DATA:  Fall.  EXAM: CT HEAD WITHOUT CONTRAST  CT NECK WITHOUT CONTRAST  TECHNIQUE: Contiguous axial images were obtained from the base of the skull through the vertex without contrast. Multidetector CT imaging of the neck was performed using the standard protocol without intravenous contrast.  COMPARISON:  CT 10/26/2013 and 07/27/2013.  FINDINGS: CT HEAD FINDINGS  No mass. No hydrocephalus. No hemorrhage. White matter changes again noted consistent with chronic ischemia. Diffuse atrophy again noted. Orbits  are unremarkable. No acute bony abnormality. Motion artifact is present. Visualized paranasal sinuses are clear.  CT NECK FINDINGS  Carotid atherosclerotic vascular disease. Left parapharyngeal soft tissue fullness most likely secondary to phase of respiration/ swallowing. This has not been prior present on prior recent exams. Pulmonary apical interstitial lung disease again noted, most likely pulmonary fibrosis. Diffuse severe cervical spine  degenerative change noted. There is no evidence of fracture or dislocation.  IMPRESSION: 1. Head CT reveals chronic white matter ischemic change and atrophy. Stable exam. No acute abnormality.  2. Cervical spine CT reveals no evidence of acute abnormality. Diffuse degenerative change present. Soft tissue fullness is noted in the left parapharyngeal space. This may be related to the patient's phase of respiration and or swallowing. This was not present on prior recent exams. Direct visualization however should be considered to exclude a left parapharyngeal mass.   Electronically Signed   By: Maisie Fus  Register   On: 11/19/2013 08:43   Dg Chest Portable 1 View  10/31/2013    IMPRESSION: Diffuse interstitial and alveolar infiltrates are most compatible with CHF. Other processes including pneumonia, alveolar sarcoidosis, alveolar proteinosis could present with similar radiographic appearances.  These findings and possible differentials were discussed with Dr. Norlene Campbell at the conclusion of the study. Followup films following therapy would be useful.   Electronically Signed   By: David  [place]   On:  07:02    Scheduled Meds: . antiseptic oral rinse  15 mL Mouth Rinse q12n4p  . aspirin  300 mg Rectal Daily  . chlorhexidine  15 mL Mouth Rinse BID  . furosemide  20 mg Intravenous Q12H  . haloperidol lactate  2.5 mg Intravenous Once  . heparin  5,000 Units Subcutaneous Q8H  . mometasone-formoterol  2 puff Inhalation BID  . sodium chloride  3 mL Intravenous Q12H    Continuous Infusions:   Principal Problem:   Acute diastolic CHF (congestive heart failure) Active Problems:   Respiratory distress   Acute respiratory failure with hypoxia   Dementia with behavioral disturbance    Time spent: 35 minutes    Hollice Espy  [facility] Pager [phone] If 7PM-7AM, please contact night-coverage at [email or web address], password [facility] 11/18/2013, 11:06 AM  LOS: 1 day

## 2013-11-19 DIAGNOSIS — I480 Paroxysmal atrial fibrillation: Secondary | ICD-10-CM | POA: Diagnosis not present

## 2013-11-19 DIAGNOSIS — N179 Acute kidney failure, unspecified: Secondary | ICD-10-CM | POA: Diagnosis present

## 2013-11-19 DIAGNOSIS — I509 Heart failure, unspecified: Secondary | ICD-10-CM

## 2013-11-19 LAB — BASIC METABOLIC PANEL
BUN: 52 mg/dL — ABNORMAL HIGH (ref 6–23)
CO2: 31 mEq/L (ref 19–32)
Calcium: 8.9 mg/dL (ref 8.4–10.5)
Creatinine, Ser: 1.65 mg/dL — ABNORMAL HIGH (ref 0.50–1.10)
GFR calc non Af Amer: 27 mL/min — ABNORMAL LOW (ref >90)
Glucose, Bld: 123 mg/dL — ABNORMAL HIGH (ref 70–99)
Potassium: 4.7 mEq/L (ref 3.5–5.1)
Sodium: 147 mEq/L — ABNORMAL HIGH (ref 135–145)

## 2013-11-19 LAB — GLUCOSE, CAPILLARY
Glucose-Capillary: 119 mg/dL — ABNORMAL HIGH (ref 70–99)
Glucose-Capillary: 122 mg/dL — ABNORMAL HIGH (ref 70–99)

## 2013-11-19 LAB — TROPONIN I
Troponin I: <0.3 ng/mL (ref <0.30)
Troponin I: <0.3 ng/mL (ref <0.30)
Troponin I: <0.3 ng/mL (ref <0.30)

## 2013-11-19 MED ORDER — SODIUM CHLORIDE 0.9 % IV SOLN
1.0000 mg/h | INTRAVENOUS | Status: DC
  Administered 2013-11-19: 1 mg/h via INTRAVENOUS
  Filled 2013-11-19: qty 10

## 2013-11-19 MED ORDER — FUROSEMIDE 10 MG/ML IJ SOLN
40.0000 mg | Freq: Two times a day (BID) | INTRAMUSCULAR | Status: DC
  Administered 2013-11-19: 40 mg via INTRAVENOUS
  Filled 2013-11-19: qty 4

## 2013-11-19 MED ORDER — DILTIAZEM HCL 100 MG IV SOLR
5.0000 mg/h | INTRAVENOUS | Status: DC
  Filled 2013-11-19: qty 100

## 2013-11-19 NOTE — Progress Notes (Signed)
Unable to start cardizem dript due to low BP of 82/45. NP on call was notified and ok t=hold med till BP improves

## 2013-11-19 NOTE — Progress Notes (Signed)
Rx Brief note:  Morphine allergy (unknown rxn)  Per Ayana RN - MD wants to start MS drip @ 0.5 mg/hr and watch pt for reaction.  Carol Perkins

## 2013-11-19 NOTE — Progress Notes (Signed)
Placed page to MD to inform him of patient's rectal temps. No new orders received at this time. Will continue to follow the plan of care.

## 2013-11-19 NOTE — ED Provider Notes (Signed)
Medical screening examination/treatment/procedure(s) were conducted as a shared visit with non-physician practitioner(s) and myself.  I personally evaluated the patient during the encounter.  EKG Interpretation    Date/Time:  Tuesday 04-Dec-2013 06:14:28 EST Ventricular Rate:  85 PR Interval:  207 QRS Duration: 100 QT Interval:  397 QTC Calculation: 472 R Axis:   98 Text Interpretation:  Sinus rhythm Right axis deviation new from prior Baseline wander Confirmed by Dezhane Staten  MD, Wilmer Berryhill ([id number]) on 12-04-13 6:17:44 AM           Please see my note from same evaluation.  Olivia Mackie, MD 11/19/13 [phone]

## 2013-11-19 NOTE — Progress Notes (Addendum)
[facility] PROGRESS NOTE  Carol Perkins ZOX:[id number] DOB: [birth year] DOA: 11/13/2013 PCP: Feliciana Rossetti, MD  Addendum at 6:55p: After discussion with patient's daughter, she feels that patient is making little progress and she does not want her mom to have any prolonged suffering.  She would like to make her comfort care, of which I agree.  Will start on gentle morphine drip for easing work of breathing.  Transfer to med surg bed.  Assessment/Plan: Principal Problem:   Acute diastolic CHF (congestive heart failure): Has diuresed approximately 3.5 L so far. Unfortunately, we've been limited in how much Lasix she can get because of soft her blood pressures. BNP significantly more elevated today because atrial fibrillation overnight. Continue to diurese and work on weaning off oxygen  Atrial fibrillation: Patient now on paroxysmal A. fib. Did not have to start Cardizem drip and his blood pressure initially was soft and she converted back to normal sinus and she appears to be going in and out of it. The better we diurese her that she keep her heart rate down. Not candidate for anticoagulation  Active Problems:   Respiratory distress: See below    Acute respiratory failure with hypoxia: Secondary to congestive heart failure. Improving, already off BiPAP. Try to transfer to floor once she she is down to nasal cannula oxygen    Dementia with behavioral disturbance: Try to minimize disorientation. Should improve as we transfer to floor.  Acute renal failure: Creatinine increasing today, if worse tomorrow because of Lasix, will be fair he minimized in we can do for her  Code Status: DO NOT RESUSCITATE Family Communication: Spoke with daughter yesterday and today Disposition Plan: Transfer to floor once better diuresed and also eventually face mask   Consultants:  None  Procedures:  Echocardiogram: Grade 1 diastolic dysfunction, systolic function at the lower end of  normal  Antibiotics:  Not  HPI/Subjective: Patient currently somnolent. No acute distress currently  Objective: Filed Vitals:   11/19/13 0730  BP: 75/39  Pulse: 64  Temp: 98.3 F (36.8 C)  Resp: 11    Intake/Output Summary (Last 24 hours) at 11/19/13 1303 Last data filed at 11/19/13 0000  Gross per 24 hour  Intake    120 ml  Output    405 ml  Net   -285 ml   Filed Weights   11/02/2013 1049 11/18/13 0400 11/19/13 0613  Weight: 67 kg (147 lb 11.3 oz) 67.8 kg (149 lb 7.6 oz) 64.9 kg (143 lb 1.3 oz)    Exam:   General:  Somnolent  Cardiovascular: Currently normal sinus rhythm, S1-S2, 2/6 systolic ejection murmur, occasional ectopic beat  Respiratory: Decreased breath sounds throughout  Abdomen: Soft, nontender, nondistended, positive bowel sounds  Musculoskeletal: No clubbing or cyanosis, trace edema   Data Reviewed: Basic Metabolic Panel:  Recent Labs Lab 10/29/2013 0650 11/18/13 0408 11/19/13 0325  NA 141 144 147*  K 4.2 4.1 4.7  CL 105 104 104  CO2 24 29 31   GLUCOSE 146* 92 123*  BUN 36* 34* 52*  CREATININE 1.19* 1.31* 1.65*  CALCIUM 9.4 8.8 8.9   Liver Function Tests: No results found for this basename: AST, ALT, ALKPHOS, BILITOT, PROT, ALBUMIN,  in the last 168 hours No results found for this basename: LIPASE, AMYLASE,  in the last 168 hours No results found for this basename: AMMONIA,  in the last 168 hours CBC:  Recent Labs Lab 11/02/2013 0650 11/18/13 0408  WBC 7.5 8.2  NEUTROABS  --  6.3  HGB  11.7* 11.4*  HCT 36.3 36.7  MCV 88.1 91.1  PLT 233 225   Cardiac Enzymes:  Recent Labs Lab 12/17/2013 0650 17-Dec-2013 1120 17-Dec-2013 1711 11/19/13 0325 11/19/13 0800  CKTOTAL  --  30  --   --   --   CKMB  --  3.3  --   --   --   TROPONINI <0.30 <0.30 <0.30 <0.30 <0.30   BNP (last 3 results)  Recent Labs  12/17/13 0655 11/19/13 0325  PROBNP 4636.0* 12632.0*   CBG: No results found for this basename: GLUCAP,  in the last 168  hours  Recent Results (from the past 240 hour(s))  CULTURE, BLOOD (ROUTINE X 2)     Status: None   Collection Time    Dec 17, 2013  6:50 AM      Result Value Range Status   Specimen Description BLOOD RIGHT ARM   Final   Special Requests BOTTLES DRAWN AEROBIC AND ANAEROBIC EACH   Final   Culture  Setup Time     Final   Value: 17-Dec-2013 12:42     Performed at [facility]   Culture     Final   Value:        BLOOD CULTURE RECEIVED NO GROWTH TO DATE CULTURE WILL BE HELD FOR 5 DAYS BEFORE ISSUING A FINAL NEGATIVE REPORT     Performed at [facility]   Report Status PENDING   Incomplete  CULTURE, BLOOD (ROUTINE X 2)     Status: None   Collection Time    December 17, 2013  6:55 AM      Result Value Range Status   Specimen Description BLOOD LEFT HAND   Final   Special Requests BOTTLES DRAWN AEROBIC AND ANAEROBIC 3.5ML EACH   Final   Culture  Setup Time     Final   Value: 2013/12/17 12:42     Performed at [facility]   Culture     Final   Value:        BLOOD CULTURE RECEIVED NO GROWTH TO DATE CULTURE WILL BE HELD FOR 5 DAYS BEFORE ISSUING A FINAL NEGATIVE REPORT     Performed at [facility]   Report Status PENDING   Incomplete  MRSA PCR SCREENING     Status: None   Collection Time    12-17-13  9:47 AM      Result Value Range Status   MRSA by PCR NEGATIVE  NEGATIVE Final   Comment:            The GeneXpert MRSA Assay (FDA     approved for NASAL specimens     only), is one component of a     comprehensive MRSA colonization     surveillance program. It is not     intended to diagnose MRSA     infection nor to guide or     monitor treatment for     MRSA infections.     Studies: Ct Head Wo Contrast  12/17/2013     IMPRESSION: 1. Head CT reveals chronic white matter ischemic change and atrophy. Stable exam. No acute abnormality.  2. Cervical spine CT reveals no evidence of acute abnormality. Diffuse degenerative change present. Soft tissue fullness is noted  in the left parapharyngeal space. This may be related to the patient's phase of respiration and or swallowing. This was not present on prior recent exams. Direct visualization however should be considered to exclude a left parapharyngeal mass.   Electronically  Signed   ByMaisie Fus  Register   On: 11/19/2013 08:43   Ct Cervical Spine Wo Contrast  10/26/2013   CLINICAL DATA:  Fall.  EXAM: CT HEAD WITHOUT CONTRAST  CT NECK WITHOUT CONTRAST  TECHNIQUE: Contiguous axial images were obtained from the base of the skull through the vertex without contrast. Multidetector CT imaging of the neck was performed using the standard protocol without intravenous contrast.  COMPARISON:  CT 10/26/2013 and 07/27/2013.  FINDINGS: CT HEAD FINDINGS  No mass. No hydrocephalus. No hemorrhage. White matter changes again noted consistent with chronic ischemia. Diffuse atrophy again noted. Orbits are unremarkable. No acute bony abnormality. Motion artifact is present. Visualized paranasal sinuses are clear.  CT NECK FINDINGS  Carotid atherosclerotic vascular disease. Left parapharyngeal soft tissue fullness most likely secondary to phase of respiration/ swallowing. This has not been prior present on prior recent exams. Pulmonary apical interstitial lung disease again noted, most likely pulmonary fibrosis. Diffuse severe cervical spine degenerative change noted. There is no evidence of fracture or dislocation.  IMPRESSION: 1. Head CT reveals chronic white matter ischemic change and atrophy. Stable exam. No acute abnormality.  2. Cervical spine CT reveals no evidence of acute abnormality. Diffuse degenerative change present. Soft tissue fullness is noted in the left parapharyngeal space. This may be related to the patient's phase of respiration and or swallowing. This was not present on prior recent exams. Direct visualization however should be considered to exclude a left parapharyngeal mass.   Electronically Signed   By: Maisie Fus  Register    On: 11/18/2013 08:43   Dg Chest Portable 1 View  10/26/2013    IMPRESSION: Diffuse interstitial and alveolar infiltrates are most compatible with CHF. Other processes including pneumonia, alveolar sarcoidosis, alveolar proteinosis could present with similar radiographic appearances.  These findings and possible differentials were discussed with Dr. Norlene Campbell at the conclusion of the study. Followup films following therapy would be useful.   Electronically Signed   By: David  [place]   On: 11/16/2013 07:02    Scheduled Meds: . antiseptic oral rinse  15 mL Mouth Rinse q12n4p  . aspirin  300 mg Rectal Daily  . chlorhexidine  15 mL Mouth Rinse BID  . furosemide  40 mg Intravenous Q12H  . haloperidol lactate  2.5 mg Intravenous Once  . heparin  5,000 Units Subcutaneous Q8H  . mometasone-formoterol  2 puff Inhalation BID  . sodium chloride  3 mL Intravenous Q12H   Continuous Infusions:   Principal Problem:   Acute diastolic CHF (congestive heart failure) Active Problems:   Respiratory distress   Acute respiratory failure with hypoxia   Dementia with behavioral disturbance    Time spent: 35 minutes    Hollice Espy  [facility] Pager [phone] If 7PM-7AM, please contact night-coverage at [email or web address], password [facility] 11/19/2013, 1:03 PM  LOS: 2 days

## 2013-11-19 NOTE — Progress Notes (Signed)
Event: Notified pt now in A-Fib w/ RVR w/ rate in 130-140's. Unable to assess for symptoms as pt w/ h/o dementia and has been extremely agitated since admission. 12-lead EKG obtained w/ is c/w A-Fib w/ RVR w/ rate of 133. NP to bedside. Subjective:  Pt unable to provide information given dementia and current level of agitation. Objective: Carol Perkins is a 79 y.o. female with a past medical history of dementia, (who lives in a dementia unit at a skilled nursing facility), sarcoidosis, hypertension, possible h/o breast cancer, who presented to ED on the morning of 11/15/2013 after an unwitnessed fall. While in ED she was noted to be SOB and hypoxic and CXR revealed findings suggestive of CHF. Pt was placed on Bi-PAP which has since been removed as pt does not tolerate. Sats kept at 100% w/ NRB at 100%. Pt has been aggressively diuresed and is currently 3L negative. At bedside pt noted very restless and crying out which has been her baseline since admission. She responds briefly to small doses of Fentanyl and Ativan. Skin is w/d. She is not currently noted to be w/ increased WOB. BBS w/ crackles at bil bases.  Assessment/Plan: 1. New onset A-Fib: Likely related to pt's CHF/acute illness. EKG w/o ischemic changes. Will start cardizem infusion (w/o bolus) given pt's labile BP's 79/46-124/82. Will cycle Troponin's which to this point have been negative. 2-D echo pending. Will hold anticoagulation given h/o recent falls (9/14 and current),  pt not likely a candidate for long term anticoagulation therapy. Discussed pt and plan w/ Dr Julian Reil who has also reviewed EKG and he is in agreement w/ plan. Will continue to monitor closely in SDU.   Leanne Chang, NP-C [facility] Pager [phone]

## 2013-11-20 DIAGNOSIS — I4891 Unspecified atrial fibrillation: Secondary | ICD-10-CM

## 2013-11-20 MED ORDER — SCOPOLAMINE 1 MG/3DAYS TD PT72
1.0000 | MEDICATED_PATCH | TRANSDERMAL | Status: DC
  Administered 2013-11-20: 1.5 mg via TRANSDERMAL
  Filled 2013-11-20: qty 1

## 2013-11-23 LAB — CULTURE, BLOOD (ROUTINE X 2): Culture: NO GROWTH

## 2013-11-26 NOTE — Progress Notes (Signed)
Patient has become unresponsive, BP of 66/36 MAP 44, HR 85, 02 sat 98 on non-rebreather.  MD notified.  Family called and updated.

## 2013-11-26 NOTE — Progress Notes (Signed)
[facility] PROGRESS NOTE  Zeda Gangwer ZOX:[id number] DOB: Sep 01, 1924 DOA: 11/22/2013 PCP: Feliciana Rossetti, MD    Assessment/Plan: Principal Problem:   Acute diastolic CHF (congestive heart failure): Has diuresed approximately 3.5 L so far. Unfortunately, we've been limited in how much Lasix she can get because of soft her blood pressures. BNP significantly more elevated today because atrial fibrillation overnight. Continue to diurese and work on weaning off oxygen. Given the overall poor prognosis and limited gains, as per patient's daughter had make patient comfort care as of yesterday. All therapies and lab work and monitoring discontinued. Patient on gentle morphine drip for ease of breathing. I expect her to pass within the next 24 hours.  Atrial fibrillation: Patient now on paroxysmal A. fib. Did not have to start Cardizem drip and his blood pressure initially was soft and she converted back to normal sinus and she appears to be going in and out of it. The better we diurese her that she keep her heart rate down. Not candidate for anticoagulation. Had discontinued telemetry for comfort care.  Active Problems:   Respiratory distress: See below    Acute respiratory failure with hypoxia: Secondary to congestive heart failure.  off BiPAP. Have discontinued therapies and now oxygen mask plus morphine drip for ease of breathing for comfort care    Dementia with behavioral disturbance: Try to minimize disorientation.   Acute renal failure: Creatinine increasing today, if worse tomorrow because of Lasix, will be fair he minimized in we can do for her. All labs now discontinued given comfort care  Code Status: Comfort care Family Communication: Spoke with daughter last night by phone  Disposition Plan: Patient is actively dying and expect her to pass within the next 24 hours   Consultants:  None  Procedures:  Echocardiogram: Grade 1 diastolic dysfunction, systolic function at the  lower end of normal  Antibiotics:  Not  HPI/Subjective: Patient is currently unresponsive on oxygen facemask, breathing appears to be agonal  Objective: Filed Vitals:   12-07-13 0800  BP: 57/27  Pulse: 82  Temp:   Resp: 19    Intake/Output Summary (Last 24 hours) at 12-07-2013 0945 Last data filed at 12-07-13 0800  Gross per 24 hour  Intake   9.33 ml  Output    700 ml  Net -690.67 ml   Filed Weights   11/07/2013 1049 11/18/13 0400 11/19/13 0613  Weight: 67 kg (147 lb 11.3 oz) 67.8 kg (149 lb 7.6 oz) 64.9 kg (143 lb 1.3 oz)    Exam:   General:  Minimally responsive  Cardiovascular: Currently normal sinus rhythm, S1-S2, 2/6 systolic ejection murmur, occasional ectopic beat  Respiratory: Scattered rhonchi  Abdomen: Soft, nontender, nondistended, positive bowel sounds  Musculoskeletal: No clubbing or cyanosis, trace edema   Data Reviewed: Basic Metabolic Panel:  Recent Labs Lab 10/28/2013 0650 11/18/13 0408 11/19/13 0325  NA 141 144 147*  K 4.2 4.1 4.7  CL 105 104 104  CO2 24 29 31   GLUCOSE 146* 92 123*  BUN 36* 34* 52*  CREATININE 1.19* 1.31* 1.65*  CALCIUM 9.4 8.8 8.9   Liver Function Tests: No results found for this basename: AST, ALT, ALKPHOS, BILITOT, PROT, ALBUMIN,  in the last 168 hours No results found for this basename: LIPASE, AMYLASE,  in the last 168 hours No results found for this basename: AMMONIA,  in the last 168 hours CBC:  Recent Labs Lab 11/02/2013 0650 11/18/13 0408  WBC 7.5 8.2  NEUTROABS  --  6.3  HGB  11.7* 11.4*  HCT 36.3 36.7  MCV 88.1 91.1  PLT 233 225   Cardiac Enzymes:  Recent Labs Lab 11/07/2013 0650 11/13/2013 1120 10/29/2013 1711 11/19/13 0325 11/19/13 0800 11/19/13 1442  CKTOTAL  --  30  --   --   --   --   CKMB  --  3.3  --   --   --   --   TROPONINI <0.30 <0.30 <0.30 <0.30 <0.30 <0.30   BNP (last 3 results)  Recent Labs  10/29/2013 0655 11/19/13 0325  PROBNP 4636.0* 12632.0*   CBG:  Recent Labs Lab  11/19/13 1517 11/19/13 1622  GLUCAP 119* 122*    Recent Results (from the past 240 hour(s))  CULTURE, BLOOD (ROUTINE X 2)     Status: None   Collection Time    10/30/2013  6:50 AM      Result Value Range Status   Specimen Description BLOOD RIGHT ARM   Final   Special Requests BOTTLES DRAWN AEROBIC AND ANAEROBIC EACH   Final   Culture  Setup Time     Final   Value: 11/11/2013 12:42     Performed at [facility]   Culture     Final   Value:        BLOOD CULTURE RECEIVED NO GROWTH TO DATE CULTURE WILL BE HELD FOR 5 DAYS BEFORE ISSUING A FINAL NEGATIVE REPORT     Performed at [facility]   Report Status PENDING   Incomplete  CULTURE, BLOOD (ROUTINE X 2)     Status: None   Collection Time    10/29/2013  6:55 AM      Result Value Range Status   Specimen Description BLOOD LEFT HAND   Final   Special Requests BOTTLES DRAWN AEROBIC AND ANAEROBIC 3.5ML EACH   Final   Culture  Setup Time     Final   Value: 11/22/2013 12:42     Performed at [facility]   Culture     Final   Value:        BLOOD CULTURE RECEIVED NO GROWTH TO DATE CULTURE WILL BE HELD FOR 5 DAYS BEFORE ISSUING A FINAL NEGATIVE REPORT     Performed at [facility]   Report Status PENDING   Incomplete  MRSA PCR SCREENING     Status: None   Collection Time    11/22/2013  9:47 AM      Result Value Range Status   MRSA by PCR NEGATIVE  NEGATIVE Final   Comment:            The GeneXpert MRSA Assay (FDA     approved for NASAL specimens     only), is one component of a     comprehensive MRSA colonization     surveillance program. It is not     intended to diagnose MRSA     infection nor to guide or     monitor treatment for     MRSA infections.     Studies: Ct Head Wo Contrast  10/26/2013     IMPRESSION: 1. Head CT reveals chronic white matter ischemic change and atrophy. Stable exam. No acute abnormality.  2. Cervical spine CT reveals no evidence of acute abnormality. Diffuse degenerative  change present. Soft tissue fullness is noted in the left parapharyngeal space. This may be related to the patient's phase of respiration and or swallowing. This was not present on prior recent exams. Direct visualization however should be  considered to exclude a left parapharyngeal mass.   Electronically Signed   By: Maisie Fus  Register   On: 11/13/2013 08:43   Ct Cervical Spine Wo Contrast  11/02/2013   CLINICAL DATA:  Fall.  EXAM: CT HEAD WITHOUT CONTRAST  CT NECK WITHOUT CONTRAST  TECHNIQUE: Contiguous axial images were obtained from the base of the skull through the vertex without contrast. Multidetector CT imaging of the neck was performed using the standard protocol without intravenous contrast.  COMPARISON:  CT 10/26/2013 and 07/27/2013.  FINDINGS: CT HEAD FINDINGS  No mass. No hydrocephalus. No hemorrhage. White matter changes again noted consistent with chronic ischemia. Diffuse atrophy again noted. Orbits are unremarkable. No acute bony abnormality. Motion artifact is present. Visualized paranasal sinuses are clear.  CT NECK FINDINGS  Carotid atherosclerotic vascular disease. Left parapharyngeal soft tissue fullness most likely secondary to phase of respiration/ swallowing. This has not been prior present on prior recent exams. Pulmonary apical interstitial lung disease again noted, most likely pulmonary fibrosis. Diffuse severe cervical spine degenerative change noted. There is no evidence of fracture or dislocation.  IMPRESSION: 1. Head CT reveals chronic white matter ischemic change and atrophy. Stable exam. No acute abnormality.  2. Cervical spine CT reveals no evidence of acute abnormality. Diffuse degenerative change present. Soft tissue fullness is noted in the left parapharyngeal space. This may be related to the patient's phase of respiration and or swallowing. This was not present on prior recent exams. Direct visualization however should be considered to exclude a left parapharyngeal mass.    Electronically Signed   By: Maisie Fus  Register   On: 11/06/2013 08:43   Dg Chest Portable 1 View  10/28/2013    IMPRESSION: Diffuse interstitial and alveolar infiltrates are most compatible with CHF. Other processes including pneumonia, alveolar sarcoidosis, alveolar proteinosis could present with similar radiographic appearances.  These findings and possible differentials were discussed with Dr. Norlene Campbell at the conclusion of the study. Followup films following therapy would be useful.   Electronically Signed   By: David  [place]   On: 11/24/2013 07:02    Scheduled Meds: . haloperidol lactate  2.5 mg Intravenous Once  . scopolamine  1 patch Transdermal Q72H   Continuous Infusions: . morphine 1 mg/hr (11/19/13 2240)    Principal Problem:   Acute diastolic CHF (congestive heart failure) Active Problems:   Respiratory distress   Acute respiratory failure with hypoxia   Dementia with behavioral disturbance   ARF (acute renal failure)   Paroxysmal a-fib    Time spent: 15 minutes    Hollice Espy  [facility] Pager [phone] If 7PM-7AM, please contact night-coverage at [email or web address], password [facility] Dec 07, 2013, 9:45 AM  LOS: 3 days

## 2013-11-26 NOTE — Progress Notes (Signed)
Morphine drip wasted in sink with Oneita Hurt RN 70 mg wasted.

## 2013-11-26 NOTE — Progress Notes (Signed)
Pt expired at 1358.  Dr. Chancy Milroy notified. Daughter called Hollie Salk.  Pt breathless and pulseless, pupils fixed.  Verified by Burnell Blanks RN.

## 2013-11-26 NOTE — Discharge Summary (Signed)
Death Summary  Carol Perkins ZOX:[id number] DOB: [birth year] DOA: 2013/12/12  PCP: Feliciana Rossetti, MD PCP/Office notified: Spoke personally with primary care physician  Admit date: 2013/12/12 Date of Death: Dec 15, 2013  Final Diagnoses:  Principal Problem:   Acute diastolic CHF (congestive heart failure) Active Problems:   Respiratory distress   Acute respiratory failure with hypoxia   Dementia with behavioral disturbance   ARF (acute renal failure)   Paroxysmal a-fib     History of present illness:  On 2023/12/13: Carol Perkins is a 80 y.o. female with a past medical history of dementia, who lives in a dementia unit at a skilled nursing facility, sarcoidosis, hypertension, possible history of breast cancer, who presents after a fall. Patient has advanced dementia and she is on a BiPAP and, hence no history is available from her. Most of the history was obtained from notes from other providers. Apparently, she came because she had a non-witnessed fall in the bathroom. It was unclear how long she was on the floor. The patient pointed to her head when she was asked about pain. And, then when she was brought in she was found to be short of breath with oxygen saturations in the mid 80s. Patient was placed on oxygen.   Hospital Course:  Patient was found to have markedly elevated BNP              and found to be in acute diastolic heart failure. She was placed in the step down unit and started on IV Lasix. She initially required BiPAP, but with some diuresis, she was able to come down to a face mask. However her course was complicated by episodes of hypotension, limiting how much Lasix she can get. In addition patient starting having episodes of paroxysmal atrial fibrillation. By the evening of 12/25, patient was making little gains and after some discussion with the patient's daughter, the patient's daughter decided to make her comfort care. Patient's medications were discontinued and she was placed on  gentle morphine drip for ease of breathing. She became less responsive but at night and patient expired at 1:58 PM on 12-15-2013.                                                                                                                                                                                                               Time: 25 min  Signed:  Hollice Espy  [facility] 2013/12/15, 5:08 PM

## 2013-11-26 DEATH — deceased

## 2014-09-04 IMAGING — CT CT CERVICAL SPINE W/O CM
4 of 8 series · 12 of 33 positions shown, 14 images · non-contrast
Comparison: CT 10/26/2013 and 07/27/2013.

CLINICAL DATA: Fall.

EXAM:
CT HEAD WITHOUT CONTRAST
CT NECK WITHOUT CONTRAST
TECHNIQUE: Contiguous axial images were obtained from the base of the skull
through the vertex without contrast. Multidetector CT imaging of the
neck was performed using the standard protocol without intravenous
contrast.

[Series 7: c-spine st · axial · 0.25mm/px · z∈[-260,-206]mm · 2 of 81 slices shown, 3 images]
[im 27/81  soft-tissue]
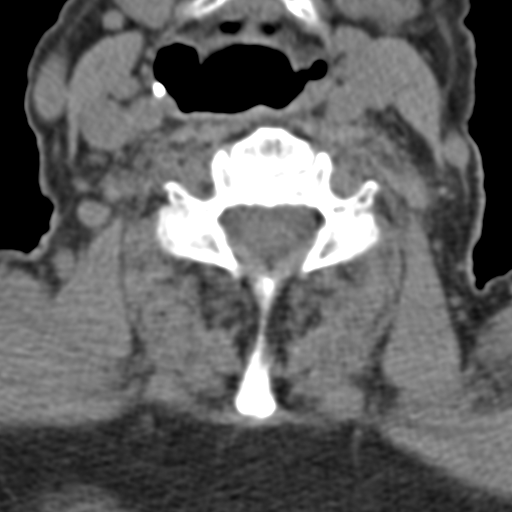
[im 27/81  bone]
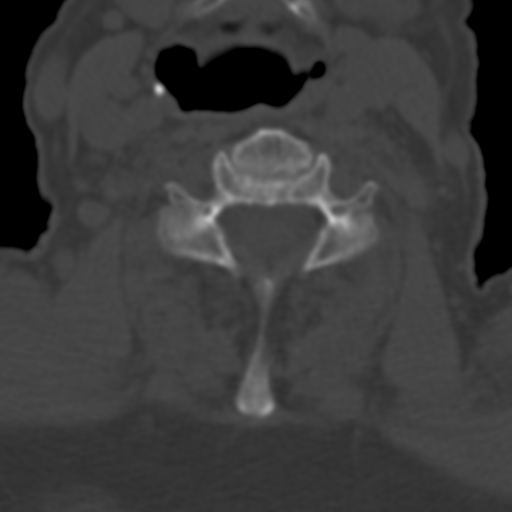
[im 54/81  bone]
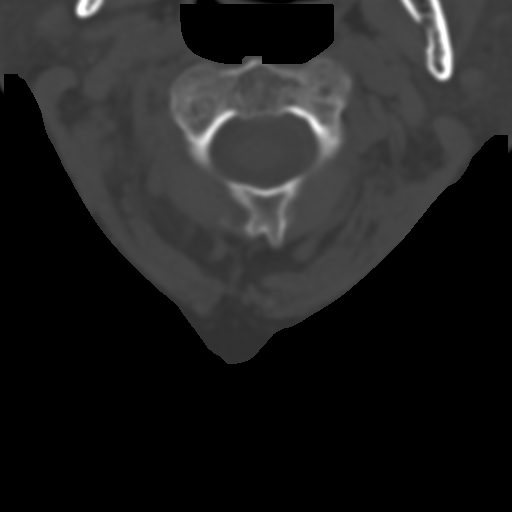

[Series 602: <mpr thick range> · coronal · 0.31mm/px · 3 of 53 slices shown]
[im 13/53  bone]
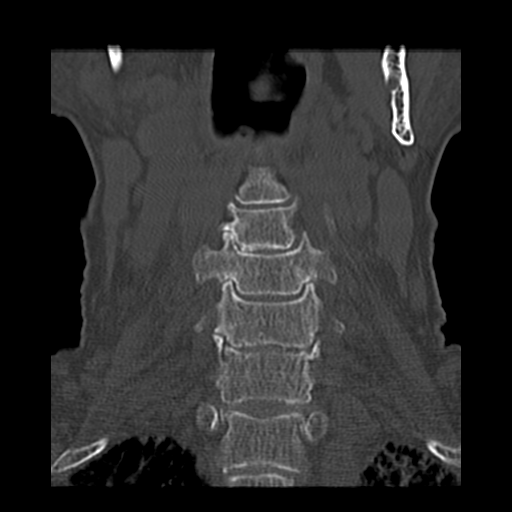
[im 22/53  bone]
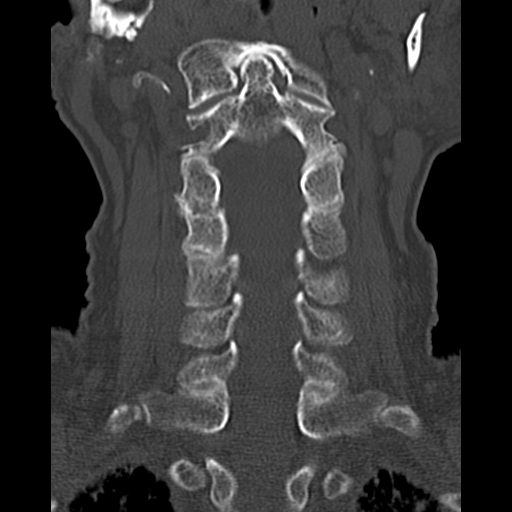
[im 31/53  bone]
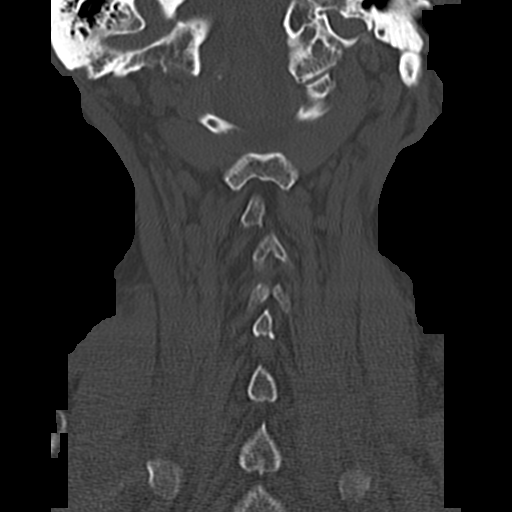

[Series 603: <mpr thick range(1)> · axial · 0.31mm/px · z∈[-301,-250]mm · 2 of 86 slices shown]
[im 29/86  bone]
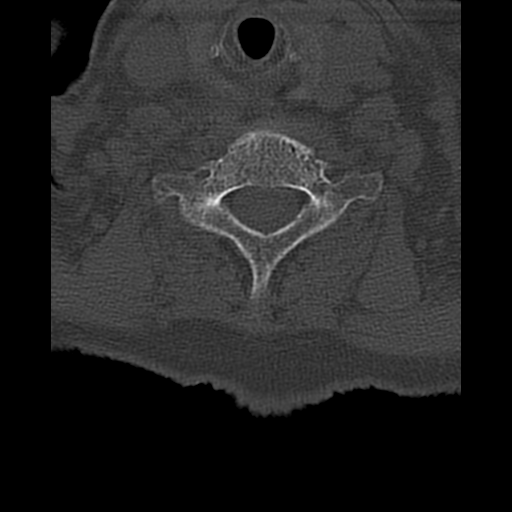
[im 57/86  bone]
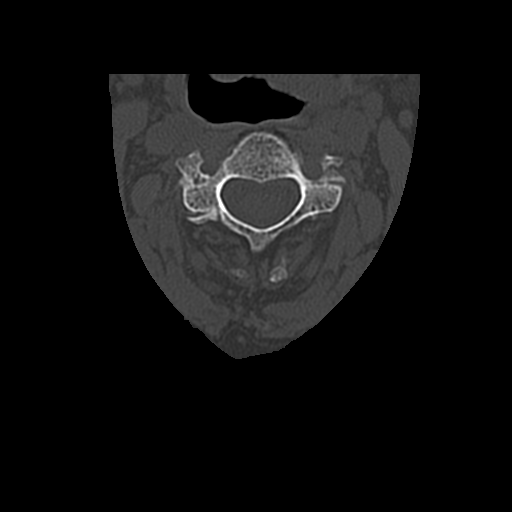

[Series 604: <mpr thick range(2)> · sagittal · 0.31mm/px · 5 of 36 slices shown, 6 images]
[im 12/36  bone]
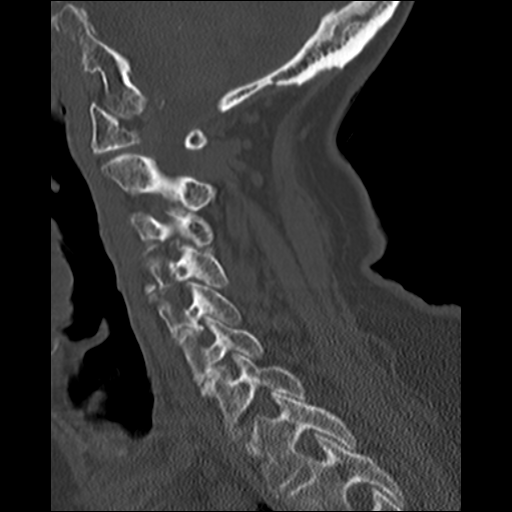
[im 15/36  bone]
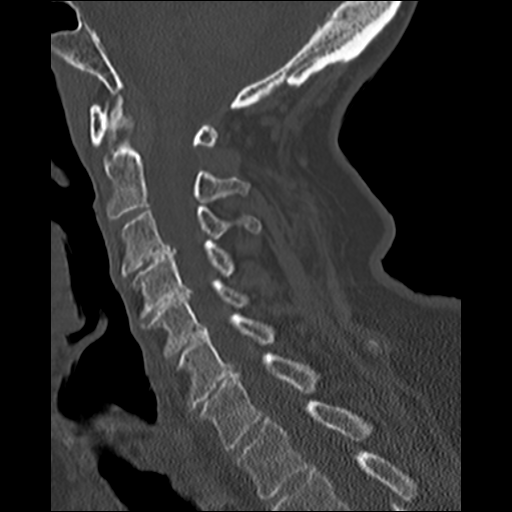
[im 18/36  soft-tissue]
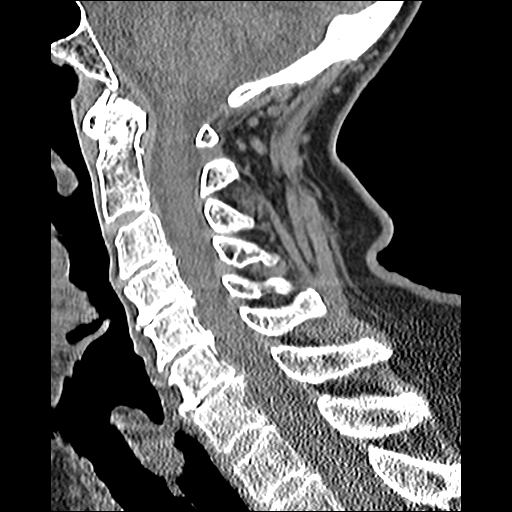
[im 18/36  bone]
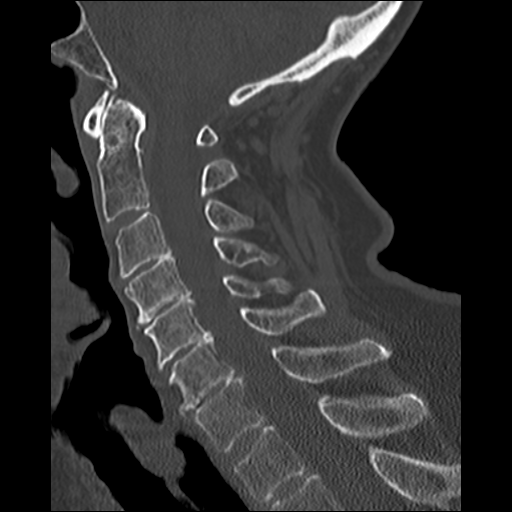
[im 21/36  bone]
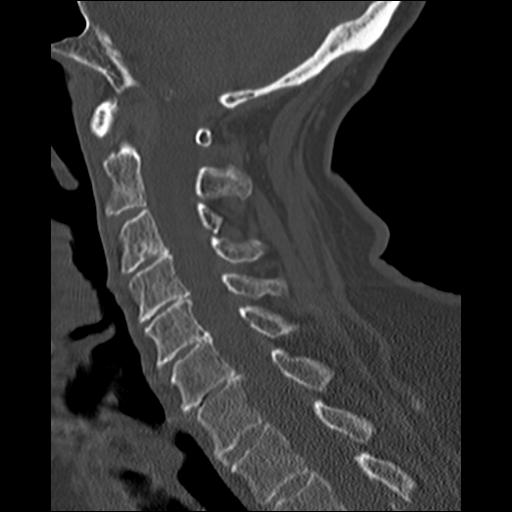
[im 24/36  bone]
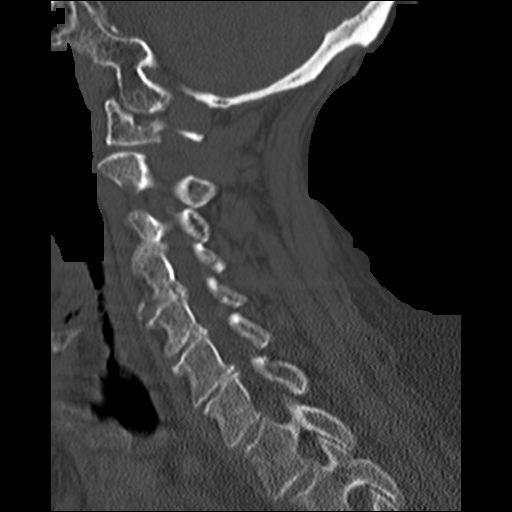

[12 of 33 positions shown; findings below may reference images not displayed]

FINDINGS: CT HEAD FINDINGS

No mass. No hydrocephalus. No hemorrhage. White matter changes again
noted consistent with chronic ischemia. Diffuse atrophy again noted.
Orbits are unremarkable. No acute bony abnormality. Motion artifact
is present. Visualized paranasal sinuses are clear.

CT NECK FINDINGS

Carotid atherosclerotic vascular disease. Left parapharyngeal soft
tissue fullness most likely secondary to phase of respiration/
swallowing. This has not been prior present on prior recent exams.
Pulmonary apical interstitial lung disease again noted, most likely
pulmonary fibrosis. Diffuse severe cervical spine degenerative
change noted. There is no evidence of fracture or dislocation.
IMPRESSION: 1. Head CT reveals chronic white matter ischemic change and atrophy.
Stable exam. No acute abnormality.

2. Cervical spine CT reveals no evidence of acute abnormality.
Diffuse degenerative change present. Soft tissue fullness is noted
in the left parapharyngeal space. This may be related to the
patient's phase of respiration and or swallowing. This was not
present on prior recent exams. Direct visualization however should
be considered to exclude a left parapharyngeal mass.
